# Patient Record
Sex: Male | Born: 1965 | Race: White | Hispanic: No | Marital: Married | Smoking: Never smoker
Health system: Southern US, Community
[De-identification: ages and names within clinical notes are randomized; demographics above are authoritative.]

## PROBLEM LIST (undated history)

## (undated) DIAGNOSIS — R7303 Prediabetes: Secondary | ICD-10-CM

## (undated) DIAGNOSIS — K219 Gastro-esophageal reflux disease without esophagitis: Secondary | ICD-10-CM

## (undated) DIAGNOSIS — G4730 Sleep apnea, unspecified: Secondary | ICD-10-CM

## (undated) DIAGNOSIS — M1990 Unspecified osteoarthritis, unspecified site: Secondary | ICD-10-CM

## (undated) DIAGNOSIS — I10 Essential (primary) hypertension: Secondary | ICD-10-CM

## (undated) DIAGNOSIS — F419 Anxiety disorder, unspecified: Secondary | ICD-10-CM

## (undated) HISTORY — PX: ESOPHAGOGASTRODUODENOSCOPY: SHX1529

## (undated) HISTORY — PX: ANAL FISSURECTOMY: SUR608

## (undated) HISTORY — PX: COLONOSCOPY: SHX174

---

## 2006-10-27 ENCOUNTER — Emergency Department: Payer: Self-pay

## 2007-10-10 ENCOUNTER — Other Ambulatory Visit: Payer: Self-pay

## 2007-10-10 ENCOUNTER — Emergency Department: Payer: Self-pay | Admitting: Emergency Medicine

## 2008-01-29 ENCOUNTER — Emergency Department: Payer: Self-pay | Admitting: Emergency Medicine

## 2008-02-10 ENCOUNTER — Emergency Department (HOSPITAL_COMMUNITY): Admission: EM | Admit: 2008-02-10 | Discharge: 2008-02-10 | Payer: Self-pay | Admitting: Emergency Medicine

## 2009-04-02 ENCOUNTER — Emergency Department: Payer: Self-pay | Admitting: Emergency Medicine

## 2009-05-14 ENCOUNTER — Ambulatory Visit: Payer: Self-pay | Admitting: Internal Medicine

## 2009-08-09 ENCOUNTER — Emergency Department: Payer: Self-pay

## 2011-04-21 LAB — URINALYSIS, ROUTINE W REFLEX MICROSCOPIC
Bilirubin Urine: NEGATIVE
Hgb urine dipstick: NEGATIVE
Specific Gravity, Urine: 1.025
Urobilinogen, UA: 1
pH: 7

## 2011-05-12 DIAGNOSIS — K210 Gastro-esophageal reflux disease with esophagitis: Secondary | ICD-10-CM

## 2011-05-12 DIAGNOSIS — F411 Generalized anxiety disorder: Secondary | ICD-10-CM | POA: Insufficient documentation

## 2011-05-12 DIAGNOSIS — I10 Essential (primary) hypertension: Secondary | ICD-10-CM | POA: Insufficient documentation

## 2011-07-26 ENCOUNTER — Emergency Department: Payer: Self-pay

## 2014-05-26 DIAGNOSIS — N529 Male erectile dysfunction, unspecified: Secondary | ICD-10-CM | POA: Insufficient documentation

## 2015-05-17 DIAGNOSIS — E785 Hyperlipidemia, unspecified: Secondary | ICD-10-CM | POA: Insufficient documentation

## 2016-01-03 DIAGNOSIS — G4733 Obstructive sleep apnea (adult) (pediatric): Secondary | ICD-10-CM | POA: Insufficient documentation

## 2017-10-16 ENCOUNTER — Encounter: Payer: Self-pay | Admitting: Emergency Medicine

## 2017-10-16 ENCOUNTER — Other Ambulatory Visit: Payer: Self-pay

## 2017-10-16 ENCOUNTER — Emergency Department: Payer: 59

## 2017-10-16 ENCOUNTER — Emergency Department
Admission: EM | Admit: 2017-10-16 | Discharge: 2017-10-16 | Disposition: A | Discharge status: Home / Self Care | Payer: 59 | Attending: Emergency Medicine | Admitting: Emergency Medicine

## 2017-10-16 DIAGNOSIS — F419 Anxiety disorder, unspecified: Secondary | ICD-10-CM | POA: Diagnosis not present

## 2017-10-16 DIAGNOSIS — I10 Essential (primary) hypertension: Secondary | ICD-10-CM | POA: Diagnosis not present

## 2017-10-16 HISTORY — DX: Gastro-esophageal reflux disease without esophagitis: K21.9

## 2017-10-16 HISTORY — DX: Essential (primary) hypertension: I10

## 2017-10-16 HISTORY — DX: Anxiety disorder, unspecified: F41.9

## 2017-10-16 LAB — CBC WITH DIFFERENTIAL/PLATELET
BASOS ABS: 0 10*3/uL (ref 0–0.1)
Basophils Relative: 1 %
EOS PCT: 2 %
Eosinophils Absolute: 0.1 10*3/uL (ref 0–0.7)
HEMATOCRIT: 50.9 % (ref 40.0–52.0)
Hemoglobin: 16.6 g/dL (ref 13.0–18.0)
LYMPHS ABS: 0.8 10*3/uL — AB (ref 1.0–3.6)
LYMPHS PCT: 16 %
MCH: 27.1 pg (ref 26.0–34.0)
MCHC: 32.6 g/dL (ref 32.0–36.0)
MCV: 83.1 fL (ref 80.0–100.0)
MONO ABS: 0.3 10*3/uL (ref 0.2–1.0)
Monocytes Relative: 7 %
NEUTROS ABS: 3.8 10*3/uL (ref 1.4–6.5)
Neutrophils Relative %: 74 %
PLATELETS: 224 10*3/uL (ref 150–440)
RBC: 6.13 MIL/uL — AB (ref 4.40–5.90)
RDW: 14.4 % (ref 11.5–14.5)
WBC: 5 10*3/uL (ref 3.8–10.6)

## 2017-10-16 LAB — BASIC METABOLIC PANEL
ANION GAP: 8 (ref 5–15)
BUN: 12 mg/dL (ref 6–20)
CO2: 27 mmol/L (ref 22–32)
Calcium: 9.4 mg/dL (ref 8.9–10.3)
Chloride: 104 mmol/L (ref 101–111)
Creatinine, Ser: 1.11 mg/dL (ref 0.61–1.24)
GFR calc Af Amer: >60 mL/min (ref >60)
GFR calc non Af Amer: >60 mL/min (ref >60)
GLUCOSE: 128 mg/dL — AB (ref 65–99)
POTASSIUM: 3.4 mmol/L — AB (ref 3.5–5.1)
Sodium: 139 mmol/L (ref 135–145)

## 2017-10-16 MED ORDER — DIAZEPAM 5 MG PO TABS
10.0000 mg | ORAL_TABLET | Freq: Once | ORAL | Status: AC
  Administered 2017-10-16: 10 mg via ORAL
  Filled 2017-10-16: qty 2

## 2017-10-16 MED ORDER — AMLODIPINE BESYLATE 5 MG PO TABS
10.0000 mg | ORAL_TABLET | Freq: Once | ORAL | Status: AC
  Administered 2017-10-16: 10 mg via ORAL
  Filled 2017-10-16: qty 2

## 2017-10-16 NOTE — ED Provider Notes (Signed)
[facility] Emergency Department Provider Note       Time seen: ----------------------------------------- 12:15 PM on 10/16/2017 -----------------------------------------   I have reviewed the triage vital signs and the nursing notes.  HISTORY   Chief Complaint Hypertension and Shortness of Breath    HPI Jeremy Gonzales is a 52 y.o. male with a history of anxiety, GERD and hypertension who presents to the ED for hypertension.  Patient went to the dentist today and had his blood pressure taken.  At the dentist office his blood pressure was noted to be 220s over 100.  He reports he has been off of his blood pressure medications for several months because he was feeling better.  On a good day his blood pressure he states is not very elevated.  He denies any recent illness, fevers, chills, chest pain, shortness of breath, vomiting or diarrhea but does have anxiety.  Past Medical History:  Diagnosis Date  . Anxiety   . GERD (gastroesophageal reflux disease)   . Hypertension     There are no active problems to display for this patient.   History reviewed. No pertinent surgical history.  Allergies Penicillins  Social History Social History   Tobacco Use  . Smoking status: Never Smoker  Substance Use Topics  . Alcohol use: Never    Frequency: Never  . Drug use: Never    Review of Systems Constitutional: Negative for fever. Cardiovascular: Negative for chest pain. Respiratory: Negative for shortness of breath. Gastrointestinal: Negative for abdominal pain, vomiting and diarrhea. Musculoskeletal: Negative for back pain. Skin: Negative for rash. Neurological: Negative for headaches, focal weakness or numbness. Psychiatric: Positive for anxiety  All systems negative/normal/unremarkable except as stated in the HPI  ____________________________________________   PHYSICAL EXAM:  VITAL SIGNS: ED Triage Vitals  Enc Vitals Group     BP 10/16/17  0924 (!) 180/104     Pulse Rate 10/16/17 0924 87     Resp 10/16/17 0924 18     Temp 10/16/17 0924 97.9 F (36.6 C)     Temp Source 10/16/17 0924 Oral     SpO2 10/16/17 0924 98 %     Weight 10/16/17 0925 250 lb (113.4 kg)     Height 10/16/17 0925 6\' 1"  (1.854 m)     Head Circumference --      Peak Flow --      Pain Score 10/16/17 0925 0     Pain Loc --      Pain Edu? --      Excl. in GC? --    Constitutional: Alert and oriented.  Anxious, no distress Eyes: Conjunctivae are normal. Normal extraocular movements. ENT   Head: Normocephalic and atraumatic.   Nose: No congestion/rhinnorhea.   Mouth/Throat: Mucous membranes are moist.   Neck: No stridor. Cardiovascular: Normal rate, regular rhythm. No murmurs, rubs, or gallops. Respiratory: Normal respiratory effort without tachypnea nor retractions. Breath sounds are clear and equal bilaterally. No wheezes/rales/rhonchi. Gastrointestinal: Soft and nontender. Normal bowel sounds Musculoskeletal: Nontender with normal range of motion in extremities. No lower extremity tenderness nor edema. Neurologic:  Normal speech and language. No gross focal neurologic deficits are appreciated.  Skin:  Skin is warm, dry and intact. No rash noted. Psychiatric: Mood and affect are normal. Speech and behavior are normal.  ____________________________________________  EKG: Interpreted by me.  Sinus rhythm rate 81 bpm, normal PR interval, normal QRS, normal QT.  ____________________________________________  ED COURSE:  As part of my medical decision making, I reviewed  the following data within the electronic MEDICAL RECORD NUMBER History obtained from family if available, nursing notes, old chart and ekg, as well as notes from prior ED visits. Patient presented for hypertension, we will assess with labs and imaging as indicated at this time.   Procedures ____________________________________________   LABS (pertinent  positives/negatives)  Labs Reviewed  CBC WITH DIFFERENTIAL/PLATELET - Abnormal; Notable for the following components:      Result Value   RBC 6.13 (*)    Lymphs Abs 0.8 (*)    All other components within normal limits  BASIC METABOLIC PANEL - Abnormal; Notable for the following components:   Potassium 3.4 (*)    Glucose, Bld 128 (*)    All other components within normal limits  ___________________________________________  DIFFERENTIAL DIAGNOSIS   Anxiety, hypertension, medication noncompliance, electrolyte abnormality  FINAL ASSESSMENT AND PLAN  Hypertension   Plan: The patient had presented for hypertension. Patient's labs are reassuring.  She was started back on his amlodipine and was given a dose of benzodiazepine here.  He is cleared for outpatient follow-up.   Ulice Dash, MD   Note: This note was generated in part or whole with voice recognition software. Voice recognition is usually quite accurate but there are transcription errors that can and very often do occur. I apologize for any typographical errors that were not detected and corrected.     Emily Filbert, MD 10/16/17 1247

## 2017-10-16 NOTE — ED Notes (Signed)
Pt presents today from dentist for HTN. Pt states at dentist office BP was 220/100's. Pt admits he is supose to be taking a HTN medication but stopped because he felt better. Pt is A/Ox4 and NAD. Awaiting EDP

## 2017-10-16 NOTE — ED Triage Notes (Signed)
Pt reports that he was at his dentist off today, they took his B/P and then he felt that his anxiety kicked in. He reports that now he feels dizzy and that he may pass out. Denies any chest pain states that he is SOB

## 2018-01-23 DIAGNOSIS — K56609 Unspecified intestinal obstruction, unspecified as to partial versus complete obstruction: Secondary | ICD-10-CM | POA: Insufficient documentation

## 2018-01-28 ENCOUNTER — Ambulatory Visit
Admission: RE | Admit: 2018-01-28 | Discharge: 2018-01-28 | Disposition: A | Discharge status: Home / Self Care | Payer: 59 | Source: Ambulatory Visit | Attending: Gastroenterology | Admitting: Gastroenterology

## 2018-01-28 ENCOUNTER — Other Ambulatory Visit: Payer: Self-pay | Admitting: Gastroenterology

## 2018-01-28 DIAGNOSIS — R110 Nausea: Secondary | ICD-10-CM | POA: Insufficient documentation

## 2018-02-21 ENCOUNTER — Ambulatory Visit: Payer: 59 | Admitting: Gastroenterology

## 2018-02-21 ENCOUNTER — Encounter: Payer: Self-pay | Admitting: Gastroenterology

## 2018-02-21 ENCOUNTER — Encounter: Payer: Self-pay | Admitting: *Deleted

## 2018-02-21 ENCOUNTER — Other Ambulatory Visit: Payer: Self-pay

## 2018-02-21 ENCOUNTER — Encounter

## 2018-02-21 VITALS — BP 177/97 | HR 85 | Ht 73.0 in | Wt 256.0 lb

## 2018-02-21 DIAGNOSIS — R140 Abdominal distension (gaseous): Secondary | ICD-10-CM

## 2018-02-21 DIAGNOSIS — R112 Nausea with vomiting, unspecified: Secondary | ICD-10-CM

## 2018-02-21 NOTE — Patient Instructions (Signed)
You are scheduled for a gastric emptying study at [facility] on Friday, August 16th @ 8:30am. Please arrive at the medical mall registration desk at 8:00am. You will need to stop all stomach medications (reflux, pain medications, nausea) 8 hours prior to scan. You cannot have anything to eat or drink after midnight.   If you need to reschedule this appointment for any reason, please contact central scheduling at [phone].

## 2018-02-21 NOTE — Progress Notes (Signed)
Gastroenterology Consultation  Referring Provider:     No ref. provider found Primary Care Physician:  Patient, No Pcp Per Primary Gastroenterologist:  Dr. Servando SnareWohl     Reason for Consultation:     Nausea        HPI:   Jeremy Gonzales is a 52 y.o. y/o male referred for consultation & management of Nausea by Dr. Patient, No Pcp Per.  This patient comes today after being [facility] 3 weeks ago for the same issues.  The patient was in the ER on July 2 With a finding on CT scan showing ileus versus partial small bowel obstruction.  The patient had started propranolol just prior to these symptoms starting.  There is report of bloating and burping.  The patient then was sent for a KUB by the nurse practitioner [facility] which showed a normal gas pattern.   The patient was recommended to double up on his PPI and to take Gas-X. The patient had a [facility] and [facility] with 2 polyps found on his screening colonoscopy that turned out to be hyperplastic and the biopsies done for his history of Barrett's esophagus did not show any Barrett's esophagus but did show signs of reflux. The patient's CT scan showing be dilated loops of small bowel [facility].   The patient reports that he feels very bloated in the morning.  He states that he drinks carbonated drinks in a large amount.  He also reports that after he eats in the morning he does not feel a bit hungry until well after lunch.  The patient thinks he has had slow motility in the past.  He was started on Reglan but denies ever having a gastric emptying study.  The patient had psychotic episodes in the Reglan and never took it again.  There is no report of any change in bowel habits black stools or bloody stools.  He also denies any vomiting above food or bile.  Past Medical History:  Diagnosis Date  . Anxiety   . GERD (gastroesophageal reflux disease)   . Hypertension     No past surgical history on file.  Prior  to Admission medications   Not on File    No family history on file.   Social History   Tobacco Use  . Smoking status: Never Smoker  Substance Use Topics  . Alcohol use: Never    Frequency: Never  . Drug use: Never    Allergies as of 02/21/2018 - never reviewed  Allergen Reaction Noted  . Metoclopramide  07/08/2017  . Promethazine  07/08/2017  . Penicillins  10/16/2017    Review of Systems:    All systems reviewed and negative except where noted in HPI.   Physical Exam:  There were no vitals taken for this visit. No LMP for male patient. General:   Alert,  Well-developed, well-nourished, pleasant and cooperative in NAD Head:  Normocephalic and atraumatic. Eyes:  Sclera clear, no icterus.   Conjunctiva pink. Ears:  Normal auditory acuity. Nose:  No deformity, discharge, or lesions. Mouth:  No deformity or lesions,oropharynx pink & moist. Neck:  Supple; no masses or thyromegaly. Lungs:  Respirations even and unlabored.  Clear throughout to auscultation.   No wheezes, crackles, or rhonchi. No acute distress. Heart:  Regular rate and rhythm; no murmurs, clicks, rubs, or gallops. Abdomen:  Normal bowel sounds.  No bruits.  Soft, non-tender and non-distended without masses, hepatosplenomegaly or  hernias noted.  No guarding or rebound tenderness.  Negative Carnett sign.   Rectal:  Deferred.  Msk:  Symmetrical without gross deformities.  Good, equal movement & strength bilaterally. Pulses:  Normal pulses noted. Extremities:  No clubbing or edema.  No cyanosis. Neurologic:  Alert and oriented x3;  grossly normal neurologically. Skin:  Intact without significant lesions or rashes.  No jaundice. Lymph Nodes:  No significant cervical adenopathy. Psych:  Alert and cooperative. Normal mood and affect.  Imaging Studies: Dg Abd Acute W/chest  Result Date: 01/28/2018 CLINICAL DATA:  One-week history of UPPER abdominal pain and nausea. Per consultation note dated 01/28/2018, the  patient had a recent abnormal CT demonstrating ileus versus small-bowel obstruction. EXAM: DG ABDOMEN ACUTE W/ 1V CHEST COMPARISON:  No prior abdominal imaging. Chest x-ray 10/16/2017. The CT mentioned in the consultation note earlier today is not in the [facility] system. FINDINGS: Bowel gas pattern unremarkable without evidence of obstruction or significant ileus. No evidence of free air or significant air-fluid levels on the erect image. Expected stool burden in the colon. Phleboliths low in the LEFT side of the pelvis. No visible opaque urinary tract calculi. Regional skeleton unremarkable. Cardiac silhouette normal in size, unchanged. Minimal atherosclerosis involving the aortic arch. Hilar and mediastinal contours otherwise unremarkable. Lungs clear. Bronchovascular markings normal. Pulmonary vascularity normal. No visible pleural effusions. No pneumothorax. IMPRESSION: 1. No acute abdominal abnormalities. 2.  No acute cardiopulmonary disease. Electronically Signed   By: Hulan Saas M.D.   On: 01/28/2018 16:01    Assessment and Plan:   Jeremy Gonzales is a 52 y.o. y/o male who had a history of ileus with resolution on a recent KUB.  The patient was seen by another gastroenterologist because he states he could not get to see me sooner.  The patient has been told that he does not need a repeat EGD since his last one did not show Barrett's and it is unlikely that peptic ulcer disease is causing his present symptoms.  The patient will be set up for a gastric emptying study.  If the study is positive the patient has been told that he will be required to eat multiple meals throughout the day and small amounts instead of 3 large meals.  If the gastric emptying study is normal the patient has been told to try and avoid carbonated drinks and see if any foods particularly his dairy products make his symptoms any worse and then see if eliminating them make him feel any better.  The patient has been explained  the plan and agrees with it.  Midge Minium, MD. Clementeen Graham    Note: This dictation was prepared with Dragon dictation along with smaller phrase technology. Any transcriptional errors that result from this process are unintentional.

## 2018-03-08 ENCOUNTER — Ambulatory Visit
Admission: RE | Admit: 2018-03-08 | Discharge: 2018-03-08 | Disposition: A | Discharge status: Home / Self Care | Payer: 59 | Source: Ambulatory Visit | Attending: Gastroenterology | Admitting: Gastroenterology

## 2018-03-08 DIAGNOSIS — R112 Nausea with vomiting, unspecified: Secondary | ICD-10-CM | POA: Diagnosis not present

## 2018-03-08 MED ORDER — TECHNETIUM TC 99M SULFUR COLLOID
2.0000 | Freq: Once | INTRAVENOUS | Status: AC | PRN
  Administered 2018-03-08: 2.7 via ORAL

## 2018-03-14 ENCOUNTER — Telehealth: Payer: Self-pay

## 2018-03-14 NOTE — Telephone Encounter (Signed)
Pt notified of gastric emptying study. He stated he is still having a lot of bloating. Please advise.

## 2018-03-18 NOTE — Telephone Encounter (Signed)
Set the patient up for a small bowel follow-through to see if there is any dilation of the small intestines to explain his bloating.

## 2018-03-19 NOTE — Telephone Encounter (Signed)
LVM for pt to return my call.

## 2018-04-05 NOTE — Telephone Encounter (Signed)
LVM for pt to return my call.

## 2018-10-20 IMAGING — CR DG ABDOMEN ACUTE W/ 1V CHEST
4 series · 4 of 4 positions shown · non-contrast
Comparison: No prior abdominal imaging.

CLINICAL DATA: One-week history of UPPER abdominal pain and nausea.
Per consultation note dated 01/28/2018, the patient had a recent
abnormal CT demonstrating ileus versus small-bowel obstruction.

EXAM:
DG ABDOMEN ACUTE W/ 1V CHEST

[chest pa]
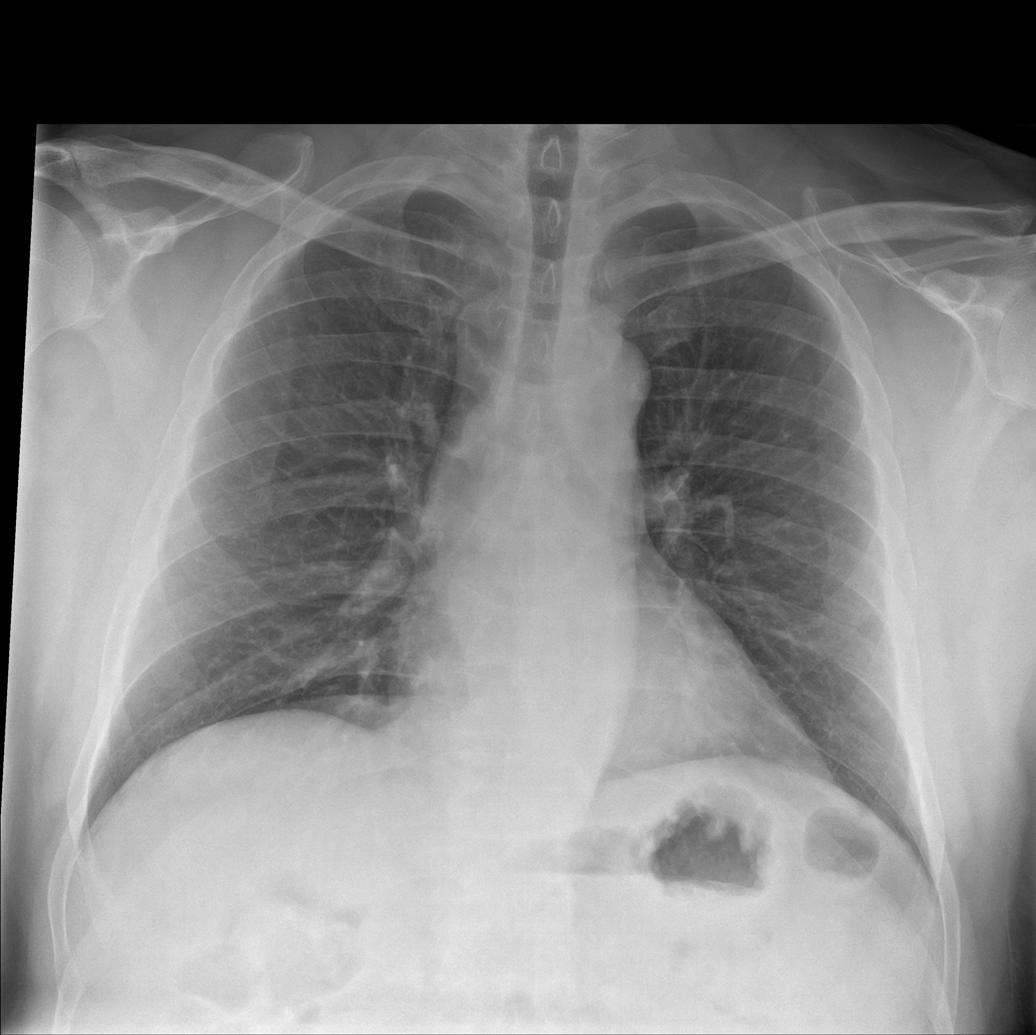

[abdomen erect]
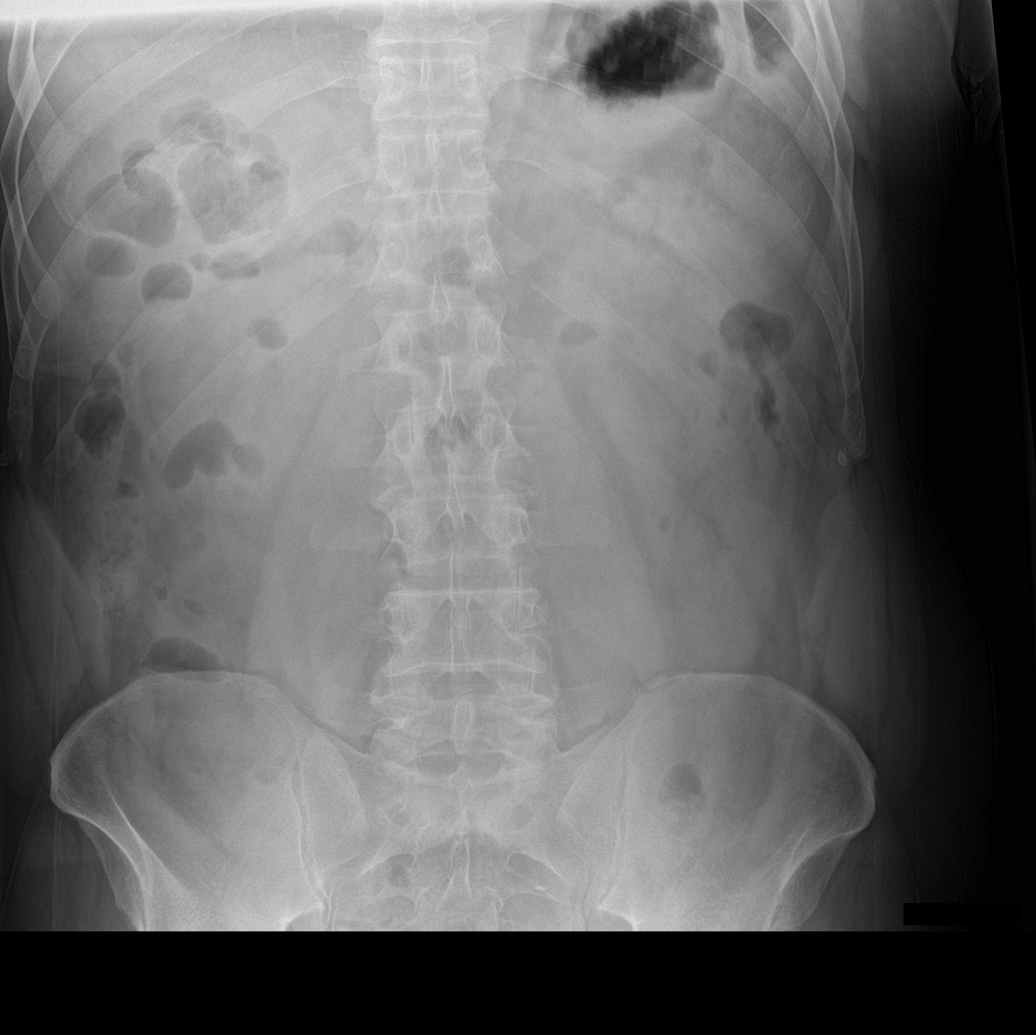

[abdomen supine (1 of 2)]
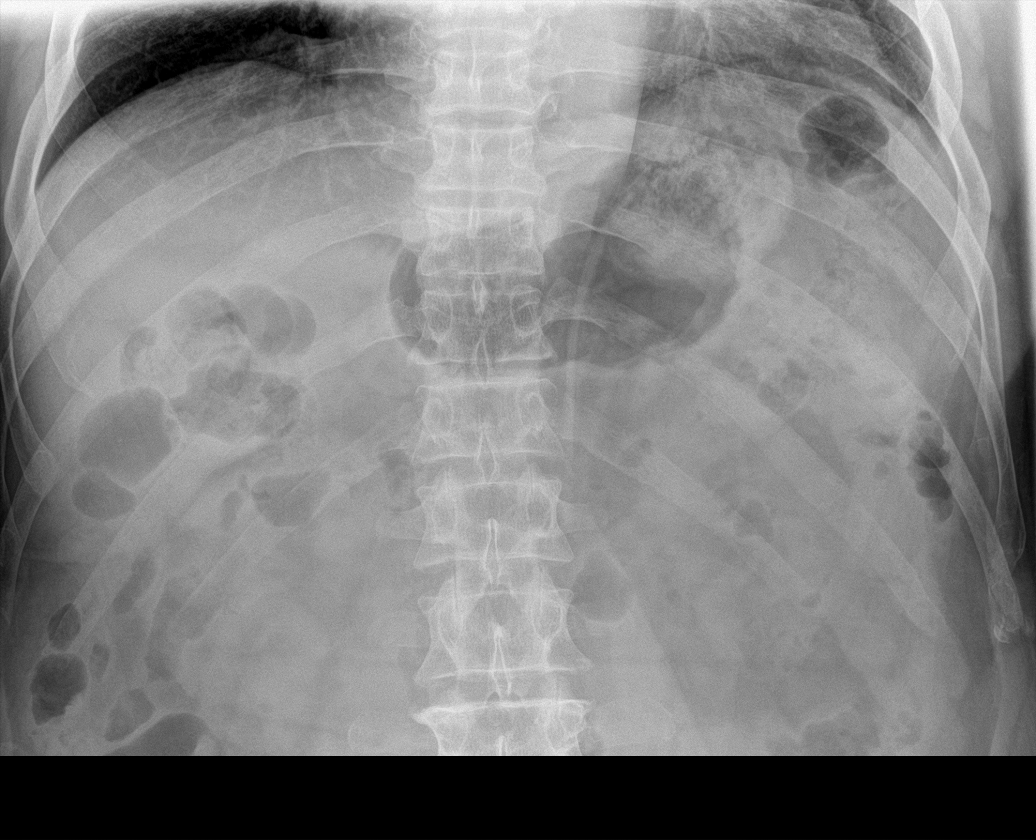

[abdomen supine (2 of 2)]
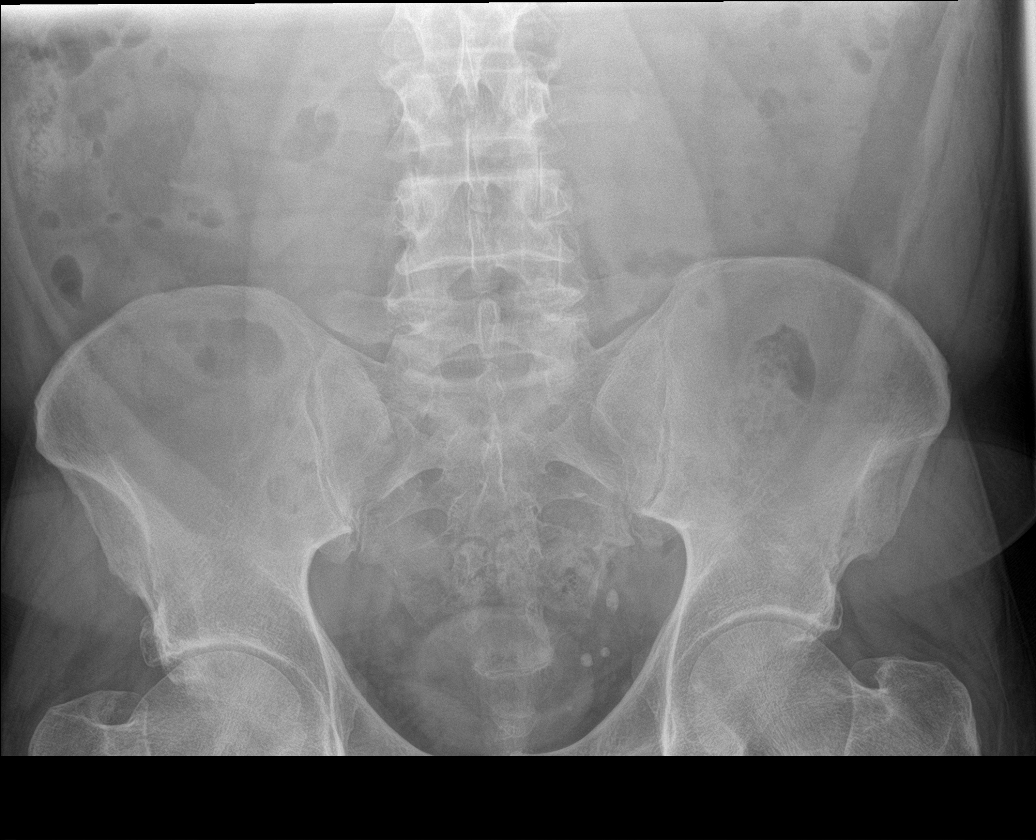

[4 of 4 positions shown; findings below may reference images not displayed]

Chest x-ray 10/16/2017. The
CT mentioned in the consultation note earlier today is not in the
[REDACTED].
FINDINGS: Bowel gas pattern unremarkable without evidence of obstruction or
significant ileus. No evidence of free air or significant air-fluid
levels on the erect image. Expected stool burden in the colon.
Phleboliths low in the LEFT side of the pelvis. No visible opaque
urinary tract calculi. Regional skeleton unremarkable.

Cardiac silhouette normal in size, unchanged.. Minimal
atherosclerosis involving the aortic arch. Hilar and mediastinal
contours otherwise unremarkable. Lungs clear. Bronchovascular
markings normal. Pulmonary vascularity normal. No visible pleural
effusions. No pneumothorax.
IMPRESSION: 1. No acute abdominal abnormalities.
2.  No acute cardiopulmonary disease.

## 2019-01-08 ENCOUNTER — Telehealth: Payer: Self-pay | Admitting: Gastroenterology

## 2019-01-08 NOTE — Telephone Encounter (Signed)
Pt left vm he went to Er yesterday or day before yesterday  For being bloated and constipated they gave him Miralax but he states it has not helped pt wanted apt for Dr. Allen Norris informed pt  I have 01/23/19 available pt wanted sooner apt please advise

## 2019-01-08 NOTE — Telephone Encounter (Signed)
Please contact pt and inform him Dr. Allen Norris is on hospital call all next week and not available for clinic. If July 2nd is the next available, then unfortunately that's all we can offer at this time unless we can any cancellations earlier in the week.

## 2019-06-23 ENCOUNTER — Ambulatory Visit: Payer: 59 | Admitting: Gastroenterology

## 2019-06-23 ENCOUNTER — Encounter: Payer: Self-pay | Admitting: *Deleted

## 2019-06-23 DIAGNOSIS — R140 Abdominal distension (gaseous): Secondary | ICD-10-CM

## 2021-03-24 NOTE — H&P (Signed)
NAME: Jeremy Gonzales, [place]GER[place]OD MEDICAL RECORD NO: [id number] ACCOUNT NO: [id number] DATE OF BIRTH: 05-26-1966 PHYSICIAN: Suszanne Conners. Evelene Croon, MD  History and Physical   DATE OF ADMISSION: 03/31/2021  Same day surgery 03/31/2021.  CHIEF COMPLAINT:  Difficulty voiding and pelvic pain.  HISTORY OF PRESENT ILLNESS:  The patient is a 55 year old white male with a long history of BPH and lower urinary tract symptoms.  He tried tamsulosin for 3 months, but had headaches and could not tolerate this.  He continues to have significant voiding  symptoms with an IPSS score of 28 and a quality of life score of 4.  He is now on alfuzosin and tadalafil and has had minimal improvement of his voiding symptoms.  Uroflow study on 08/25 indicated a maximum flow rate of 12 mL per second with an average  flow rate of 6.3 mL per second and a postvoid residual of 9.9 mL based upon a voided volume of 215 mL. Prostate ultrasound on 08/08 indicated a 29.6 mL prostate without hypoechoic lesions.  He comes in now for cystoscopy.  PAST MEDICAL HISTORY:  ALLERGIES:  THE PATIENT IS ALLERGIC TO PENICILLIN.  CURRENT MEDICATIONS:  Aciphex, amlodipine, Ativan, tadalafil, and alfuzosin.  PAST SURGICAL HISTORY:  Anal anal fissure repair in 1990.  PAST AND CURRENT MEDICAL CONDITIONS: 1.  Hypertension. 2.  GERD. 3.  Anxiety. 4.  Borderline diabetes.  REVIEW OF SYSTEMS:  The patient denies chest pain, shortness of breath, heart disease, or stroke.  SOCIAL HISTORY:  The patient denies tobacco or alcohol use.  FAMILY HISTORY:  Father died at age 69 with pancreatic cancer, but also had diabetes and hypertension.  Mother died at age 19 of a stroke and had diabetes.  There is no family history of urologic disease or urological malignancy.  PHYSICAL EXAMINATION: VITAL SIGNS:  Height 6 feet 1 inch, weight 256 pounds, BMI 34. GENERAL:  Obese white male in no acute distress. HEENT:  Sclerae are clear.  Pupils are equally round, reactive  to light and accommodation.  Extraocular movements are intact. NECK:  No palpable masses or tenderness. LYMPHATIC:  No palpable cervical or inguinal adenopathy. PULMONARY:  Lungs clear to auscultation. CARDIOVASCULAR:  Regular rhythm and rate without audible murmurs. ABDOMEN:  Soft, nontender.  No CVA tenderness. GENITOURINARY:  Circumcised.  Testes smooth, nontender, approximately 20 mL in size each. RECTAL:  40 gram, smooth, nontender prostate. NEUROVASCULAR:  Alert and oriented x3.  IMPRESSION: 1.  Benign prostatic hypertrophy with lower urinary tract symptoms. 2.  Erectile dysfunction. 3.  Pelvic and back pain.  PLAN:  Cystoscopy.   ROH D: 03/23/2021 3:19:28 pm T: 03/23/2021 5:10:00 pm  JOB: [id number]/ [id number]

## 2021-03-25 ENCOUNTER — Other Ambulatory Visit: Payer: Self-pay

## 2021-03-25 ENCOUNTER — Encounter
Admission: RE | Admit: 2021-03-25 | Discharge: 2021-03-25 | Disposition: A | Discharge status: Home / Self Care | Payer: 59 | Source: Ambulatory Visit | Attending: Urology | Admitting: Urology

## 2021-03-25 HISTORY — DX: Sleep apnea, unspecified: G47.30

## 2021-03-25 NOTE — Patient Instructions (Addendum)
Your procedure is scheduled on:03-31-21 Thursday Report to the Registration Desk on the 1st floor of the Medical Mall.Then proceed to the 2nd floor Surgery Desk in the Medical Mall To find out your arrival time, please call [phone] between 1PM - 3PM on:03-30-21 Wednesday  REMEMBER: Instructions that are not followed completely may result in serious medical risk, up to and including death; or upon the discretion of your surgeon and anesthesiologist your surgery may need to be rescheduled.  Do not eat food after midnight the night before surgery.  No gum chewing, lozengers or hard candies.  You may however, drink CLEAR liquids up to 2 hours before you are scheduled to arrive for your surgery. Do not drink anything within 2 hours of your scheduled arrival time.  Clear liquids include: - water  - apple juice without pulp - gatorade (not RED, PURPLE, OR BLUE) - black coffee or tea (Do NOT add milk or creamers to the coffee or tea) Do NOT drink anything that is not on this list.  TAKE THESE MEDICATIONS THE MORNING OF SURGERY WITH A SIP OF WATER: -Aciphex (Rabeprazole)-take one the night before and one on the morning of surgery - helps to prevent nausea after surgery. -You may take Ativan (Lorazepam) if needed for anxiety the day of surgery   One week prior to surgery: Stop Anti-inflammatories (NSAIDS) such as Advil, Aleve, Ibuprofen, Motrin, Naproxen, Naprosyn and Aspirin based products such as Excedrin, Goodys Powder, BC Powder.You may however, continue to take Tylenol if needed for pain up until the day of surgery.  Stop ANY OVER THE COUNTER supplements/vitamins NOW (03-25-21) until after surgery.  No Alcohol for 24 hours before or after surgery.  No Smoking including e-cigarettes for 24 hours prior to surgery.  No chewable tobacco products for at least 6 hours prior to surgery.  No nicotine patches on the day of surgery.  Do not use any "recreational" drugs for at least a week prior  to your surgery.  Please be advised that the combination of cocaine and anesthesia may have negative outcomes, up to and including death. If you test positive for cocaine, your surgery will be cancelled.  On the morning of surgery brush your teeth with toothpaste and water, you may rinse your mouth with mouthwash if you wish. Do not swallow any toothpaste or mouthwash.  Do not wear jewelry, make-up, hairpins, clips or nail polish.  Do not wear lotions, powders, or perfumes.   Do not shave body from the neck down 48 hours prior to surgery just in case you cut yourself which could leave a site for infection.  Also, freshly shaved skin may become irritated if using the CHG soap.  Contact lenses, hearing aids and dentures may not be worn into surgery.  Do not bring valuables to the hospital. [facility] is not responsible for any missing/lost belongings or valuables.   Bring your C-PAP to the hospital with you  Notify your doctor if there is any change in your medical condition (cold, fever, infection).  Wear comfortable clothing (specific to your surgery type) to the hospital.  After surgery, you can help prevent lung complications by doing breathing exercises.  Take deep breaths and cough every 1-2 hours. Your doctor may order a device called an Incentive Spirometer to help you take deep breaths. When coughing or sneezing, hold a pillow firmly against your incision with both hands. This is called "splinting." Doing this helps protect your incision. It also decreases belly discomfort.  If  you are being admitted to the hospital overnight, leave your suitcase in the car. After surgery it may be brought to your room.  If you are being discharged the day of surgery, you will not be allowed to drive home. You will need a responsible adult (18 years or older) to drive you home and stay with you that night.   If you are taking public transportation, you will need to have a responsible adult  (18 years or older) with you. Please confirm with your physician that it is acceptable to use public transportation.   Please call the Pre-admissions Testing Dept. at [phone] if you have any questions about these instructions.  Surgery Visitation Policy:  Patients undergoing a surgery or procedure may have one family member or support person with them as long as that person is not COVID-19 positive or experiencing its symptoms.  That person may remain in the waiting area during the procedure.  Inpatient Visitation:    Visiting hours are 7 a.m. to 8 p.m. Inpatients will be allowed two visitors daily. The visitors may change each day during the patient's stay. No visitors under the age of 62. Any visitor under the age of 70 must be accompanied by an adult. The visitor must pass COVID-19 screenings, use hand sanitizer when entering and exiting the patient's room and wear a mask at all times, including in the patient's room. Patients must also wear a mask when staff or their visitor are in the room. Masking is required regardless of vaccination status.

## 2021-03-31 ENCOUNTER — Ambulatory Visit: Payer: BC Managed Care – PPO | Admitting: Certified Registered"

## 2021-03-31 ENCOUNTER — Encounter
Admission: RE | Disposition: A | Discharge status: Home / Self Care | Payer: Self-pay | Source: Home / Self Care | Attending: Urology

## 2021-03-31 ENCOUNTER — Encounter: Payer: Self-pay | Admitting: Urology

## 2021-03-31 ENCOUNTER — Ambulatory Visit
Admission: RE | Admit: 2021-03-31 | Discharge: 2021-03-31 | Disposition: A | Discharge status: Home / Self Care | Payer: BC Managed Care – PPO | Attending: Urology | Admitting: Urology

## 2021-03-31 DIAGNOSIS — Z79899 Other long term (current) drug therapy: Secondary | ICD-10-CM | POA: Insufficient documentation

## 2021-03-31 DIAGNOSIS — Z6834 Body mass index (BMI) 34.0-34.9, adult: Secondary | ICD-10-CM | POA: Diagnosis not present

## 2021-03-31 DIAGNOSIS — Z823 Family history of stroke: Secondary | ICD-10-CM | POA: Diagnosis not present

## 2021-03-31 DIAGNOSIS — R102 Pelvic and perineal pain: Secondary | ICD-10-CM | POA: Diagnosis not present

## 2021-03-31 DIAGNOSIS — R350 Frequency of micturition: Secondary | ICD-10-CM | POA: Insufficient documentation

## 2021-03-31 DIAGNOSIS — Z833 Family history of diabetes mellitus: Secondary | ICD-10-CM | POA: Diagnosis not present

## 2021-03-31 DIAGNOSIS — K219 Gastro-esophageal reflux disease without esophagitis: Secondary | ICD-10-CM | POA: Insufficient documentation

## 2021-03-31 DIAGNOSIS — E669 Obesity, unspecified: Secondary | ICD-10-CM | POA: Insufficient documentation

## 2021-03-31 DIAGNOSIS — Z880 Allergy status to penicillin: Secondary | ICD-10-CM | POA: Diagnosis not present

## 2021-03-31 DIAGNOSIS — Z8042 Family history of malignant neoplasm of prostate: Secondary | ICD-10-CM | POA: Diagnosis not present

## 2021-03-31 DIAGNOSIS — I10 Essential (primary) hypertension: Secondary | ICD-10-CM | POA: Insufficient documentation

## 2021-03-31 DIAGNOSIS — N401 Enlarged prostate with lower urinary tract symptoms: Secondary | ICD-10-CM | POA: Diagnosis not present

## 2021-03-31 HISTORY — PX: CYSTOSCOPY: SHX5120

## 2021-03-31 SURGERY — CYSTOSCOPY
Anesthesia: General

## 2021-03-31 MED ORDER — FENTANYL CITRATE (PF) 100 MCG/2ML IJ SOLN: 25.0000 ug | INTRAMUSCULAR | Status: DC | PRN

## 2021-03-31 MED ORDER — DEXMEDETOMIDINE (PRECEDEX) IN NS 20 MCG/5ML (4 MCG/ML) IV SYRINGE
PREFILLED_SYRINGE | INTRAVENOUS | Status: DC | PRN
  Administered 2021-03-31: 8 ug via INTRAVENOUS
  Administered 2021-03-31: 12 ug via INTRAVENOUS

## 2021-03-31 MED ORDER — LIDOCAINE HCL URETHRAL/MUCOSAL 2 % EX GEL
CUTANEOUS | Status: DC | PRN
  Administered 2021-03-31: 1 via URETHRAL

## 2021-03-31 MED ORDER — LIDOCAINE HCL (CARDIAC) PF 100 MG/5ML IV SOSY
PREFILLED_SYRINGE | INTRAVENOUS | Status: DC | PRN
  Administered 2021-03-31: 100 mg via INTRAVENOUS

## 2021-03-31 MED ORDER — LACTATED RINGERS IV SOLN: INTRAVENOUS | Status: DC

## 2021-03-31 MED ORDER — ONDANSETRON HCL 4 MG/2ML IJ SOLN: 4.0000 mg | Freq: Once | INTRAMUSCULAR | Status: DC | PRN

## 2021-03-31 MED ORDER — GLYCOPYRROLATE 0.2 MG/ML IJ SOLN
INTRAMUSCULAR | Status: DC | PRN
  Administered 2021-03-31: .2 mg via INTRAVENOUS

## 2021-03-31 MED ORDER — CHLORHEXIDINE GLUCONATE 0.12 % MT SOLN: 15.0000 mL | Freq: Once | OROMUCOSAL | Status: AC

## 2021-03-31 MED ORDER — PROPOFOL 10 MG/ML IV BOLUS
INTRAVENOUS | Status: AC
  Filled 2021-03-31: qty 40

## 2021-03-31 MED ORDER — PHENYLEPHRINE HCL (PRESSORS) 10 MG/ML IV SOLN
INTRAVENOUS | Status: DC | PRN
  Administered 2021-03-31: 100 ug via INTRAVENOUS

## 2021-03-31 MED ORDER — DEXAMETHASONE SODIUM PHOSPHATE 10 MG/ML IJ SOLN
INTRAMUSCULAR | Status: DC | PRN
  Administered 2021-03-31: 8 mg via INTRAVENOUS

## 2021-03-31 MED ORDER — CHLORHEXIDINE GLUCONATE 0.12 % MT SOLN
OROMUCOSAL | Status: AC
  Administered 2021-03-31: 15 mL via OROMUCOSAL
  Filled 2021-03-31: qty 15

## 2021-03-31 MED ORDER — URIBEL 118 MG PO CAPS: 1.0000 | ORAL_CAPSULE | Freq: Four times a day (QID) | ORAL | 3 refills | Status: DC | PRN

## 2021-03-31 MED ORDER — ONDANSETRON HCL 4 MG/2ML IJ SOLN
INTRAMUSCULAR | Status: DC | PRN
  Administered 2021-03-31: 4 mg via INTRAVENOUS

## 2021-03-31 MED ORDER — LEVOFLOXACIN IN D5W 500 MG/100ML IV SOLN
500.0000 mg | Freq: Once | INTRAVENOUS | Status: AC
  Administered 2021-03-31: 500 mg via INTRAVENOUS

## 2021-03-31 MED ORDER — LEVOFLOXACIN 500 MG PO TABS: 500.0000 mg | ORAL_TABLET | Freq: Every day | ORAL | 0 refills | Status: DC

## 2021-03-31 MED ORDER — LIDOCAINE HCL URETHRAL/MUCOSAL 2 % EX GEL
CUTANEOUS | Status: AC
  Filled 2021-03-31: qty 10

## 2021-03-31 MED ORDER — MIDAZOLAM HCL 2 MG/2ML IJ SOLN
INTRAMUSCULAR | Status: DC | PRN
  Administered 2021-03-31: 2 mg via INTRAVENOUS

## 2021-03-31 MED ORDER — PHENYLEPHRINE HCL (PRESSORS) 10 MG/ML IV SOLN
INTRAVENOUS | Status: AC
  Filled 2021-03-31: qty 1

## 2021-03-31 MED ORDER — FENTANYL CITRATE (PF) 100 MCG/2ML IJ SOLN
INTRAMUSCULAR | Status: AC
  Filled 2021-03-31: qty 2

## 2021-03-31 MED ORDER — MIDAZOLAM HCL 2 MG/2ML IJ SOLN
INTRAMUSCULAR | Status: AC
  Filled 2021-03-31: qty 2

## 2021-03-31 MED ORDER — LEVOFLOXACIN IN D5W 500 MG/100ML IV SOLN
INTRAVENOUS | Status: AC
  Filled 2021-03-31: qty 100

## 2021-03-31 MED ORDER — PROPOFOL 10 MG/ML IV BOLUS
INTRAVENOUS | Status: DC | PRN
  Administered 2021-03-31: 200 mg via INTRAVENOUS

## 2021-03-31 MED ORDER — FENTANYL CITRATE (PF) 100 MCG/2ML IJ SOLN
INTRAMUSCULAR | Status: DC | PRN
  Administered 2021-03-31: 100 ug via INTRAVENOUS

## 2021-03-31 MED ORDER — ORAL CARE MOUTH RINSE: 15.0000 mL | Freq: Once | OROMUCOSAL | Status: AC

## 2021-03-31 SURGICAL SUPPLY — 19 items
BAG DRAIN CYSTO-URO LG1000N (MISCELLANEOUS) ×2 IMPLANT
DRAPE UTILITY 15X26 TOWEL STRL (DRAPES) ×1 IMPLANT
ELECT REM PT RETURN 9FT ADLT (ELECTROSURGICAL) ×2
ELECTRODE REM PT RTRN 9FT ADLT (ELECTROSURGICAL) ×1 IMPLANT
GAUZE 4X4 16PLY ~~LOC~~+RFID DBL (SPONGE) ×3 IMPLANT
GLOVE SURG ENC MOIS LTX SZ7 (GLOVE) ×2 IMPLANT
GLOVE SURG ENC MOIS LTX SZ7.5 (GLOVE) ×2 IMPLANT
GOWN STRL REUS W/ TWL LRG LVL3 (GOWN DISPOSABLE) ×1 IMPLANT
GOWN STRL REUS W/ TWL XL LVL3 (GOWN DISPOSABLE) ×1 IMPLANT
GOWN STRL REUS W/TWL LRG LVL3 (GOWN DISPOSABLE) ×2
GOWN STRL REUS W/TWL XL LVL3 (GOWN DISPOSABLE) ×2
KIT TURNOVER CYSTO (KITS) ×2 IMPLANT
MANIFOLD NEPTUNE II (INSTRUMENTS) ×2 IMPLANT
PACK CYSTO AR (MISCELLANEOUS) ×2 IMPLANT
SET CYSTO W/LG BORE CLAMP LF (SET/KITS/TRAYS/PACK) ×2 IMPLANT
SURGILUBE 2OZ TUBE FLIPTOP (MISCELLANEOUS) ×2 IMPLANT
WATER STERILE IRR 1000ML POUR (IV SOLUTION) ×1 IMPLANT
WATER STERILE IRR 3000ML UROMA (IV SOLUTION) ×2 IMPLANT
WATER STERILE IRR 500ML POUR (IV SOLUTION) ×3 IMPLANT

## 2021-03-31 NOTE — Transfer of Care (Signed)
Immediate Anesthesia Transfer of Care Note  Patient: Jeremy Gonzales  Procedure(s) Performed: CYSTOSCOPY  Patient Location: PACU  Anesthesia Type:General  Level of Consciousness: drowsy  Airway & Oxygen Therapy: Patient Spontanous Breathing and Patient connected to face mask oxygen  Post-op Assessment: Report given to RN  Post vital signs: stable  Last Vitals:  Vitals Value Taken Time  BP 112/72 03/31/21 0800  Temp    Pulse 72 03/31/21 0800  Resp 10 03/31/21 0800  SpO2 97 % 03/31/21 0800  Vitals shown include unvalidated device data.  Last Pain:  Vitals:   03/31/21 0617  PainSc: 0-No pain         Complications: No notable events documented.

## 2021-03-31 NOTE — Discharge Instructions (Addendum)
Cystoscopy Cystoscopy is a procedure that is used to help diagnose and sometimes treat conditions that affect the lower urinary tract. The lower urinary tract includes the bladder and the urethra. The urethra is the tube that drains urine from the bladder. Cystoscopy is done using a thin, tube-shaped instrument with a light and camera at the end (cystoscope). The cystoscope may be hard or flexible, depending on the goal of the procedure. The cystoscope is inserted through the urethra, into the bladder. Cystoscopy may be recommended if you have: Urinary tract infections that keep coming back. Blood in the urine (hematuria). An inability to control when you urinate (urinary incontinence) or an overactive bladder. Unusual cells found in a urine sample. A blockage in the urethra, such as a urinary stone. Painful urination. An abnormality in the bladder found during an intravenous pyelogram (IVP) or CT scan. Cystoscopy may also be done to remove a sample of tissue to be examined under a microscope (biopsy). Tell a health care provider about: Any allergies you have. All medicines you are taking, including vitamins, herbs, eye drops, creams, and over-the-counter medicines. Any problems you or family members have had with anesthetic medicines. Any blood disorders you have. Any surgeries you have had. Any medical conditions you have. Whether you are pregnant or may be pregnant. What are the risks? Generally, this is a safe procedure. However, problems may occur, including: Infection. Bleeding. Allergic reactions to medicines. Damage to other structures or organs. What happens before the procedure? Medicines Ask your health care provider about: Changing or stopping your regular medicines. This is especially important if you are taking diabetes medicines or blood thinners. Taking medicines such as aspirin and ibuprofen. These medicines can thin your blood. Do not take these medicines unless your  health care provider tells you to take them. Taking over-the-counter medicines, vitamins, herbs, and supplements. Tests You may have an exam or testing, such as: X-rays of the bladder, urethra, or kidneys. CT scan of the abdomen or pelvis. Urine tests to check for signs of infection. General instructions Follow instructions from your health care provider about eating or drinking restrictions. Ask your health care provider what steps will be taken to help prevent infection. These steps may include: Washing skin with a germ-killing soap. Taking antibiotic medicine. Plan to have a responsible adult take you home from the hospital or clinic. What happens during the procedure? You will be given one or more of the following: A medicine to help you relax (sedative). A medicine to numb the area (local anesthetic). The area around the opening of your urethra will be cleaned. The cystoscope will be passed through your urethra into your bladder. Germ-free (sterile) fluid will flow through the cystoscope to fill your bladder. The fluid will stretch your bladder so that your health care provider can clearly examine your bladder walls. Your doctor will look at the urethra and bladder. Your doctor may take a biopsy or remove stones. The cystoscope will be removed, and your bladder will be emptied. The procedure may vary among health care providers and hospitals. What can I expect after the procedure? After the procedure, it is common to have: Some soreness or pain in your abdomen and urethra. Urinary symptoms. These include: Mild pain or burning when you urinate. Pain should stop within a few minutes after you urinate. This may last for up to 1 week. A small amount of blood in your urine for several days. Feeling like you need to urinate but producing only a  small amount of urine. Follow these instructions at home: Medicines Take over-the-counter and prescription medicines only as told by your  health care provider. If you were prescribed an antibiotic medicine, take it as told by your health care provider. Do not stop taking the antibiotic even if you start to feel better. General instructions Return to your normal activities as told by your health care provider. Ask your health care provider what activities are safe for you. If you were given a sedative during the procedure, it can affect you for several hours. Do not drive or operate machinery until your health care provider says that it is safe. Watch for any blood in your urine. If the amount of blood in your urine increases, call your health care provider. Follow instructions from your health care provider about eating or drinking restrictions. If a tissue sample was removed for testing (biopsy) during your procedure, it is up to you to get your test results. Ask your health care provider, or the department that is doing the test, when your results will be ready. Drink enough fluid to keep your urine pale yellow. Keep all follow-up visits. This is important. Contact a health care provider if: You have pain that gets worse or does not get better with medicine, especially pain when you urinate. You have trouble urinating. You have more blood in your urine. Get help right away if: You have blood clots in your urine. You have abdominal pain. You have a fever or chills. You are unable to urinate. Summary Cystoscopy is a procedure that is used to help diagnose and sometimes treat conditions that affect the lower urinary tract. Cystoscopy is done using a thin, tube-shaped instrument with a light and camera at the end. After the procedure, it is common to have some soreness or pain in your abdomen and urethra. Watch for any blood in your urine. If the amount of blood in your urine increases, call your health care provider. If you were prescribed an antibiotic medicine, take it as told by your health care provider. Do not stop taking  the antibiotic even if you start to feel better. This information is not intended to replace advice given to you by your health care provider. Make sure you discuss any questions you have with your health care provider. Document Revised: 02/20/2020 Document Reviewed: 02/20/2020 Elsevier Patient Education  2022 [employer]    AMBULATORY SURGERY  DISCHARGE INSTRUCTIONS   The drugs that you were given will stay in your system until tomorrow so for the next 24 hours you should not:  Drive an automobile Make any legal decisions Drink any alcoholic beverage   You may resume regular meals tomorrow.  Today it is better to start with liquids and gradually work up to solid foods.  You may eat anything you prefer, but it is better to start with liquids, then soup and crackers, and gradually work up to solid foods.   Please notify your doctor immediately if you have any unusual bleeding, trouble breathing, redness and pain at the surgery site, drainage, fever, or pain not relieved by medication.     Additional Instructions:

## 2021-03-31 NOTE — Anesthesia Procedure Notes (Signed)
Procedure Name: LMA Insertion Date/Time: 03/31/2021 7:34 AM Performed by: Jaye Beagle, CRNA Pre-anesthesia Checklist: Patient identified, Emergency Drugs available, Suction available and Patient being monitored Patient Re-evaluated:Patient Re-evaluated prior to induction Oxygen Delivery Method: Circle system utilized Preoxygenation: Pre-oxygenation with 100% oxygen Induction Type: IV induction Ventilation: Mask ventilation without difficulty LMA: LMA inserted LMA Size: 5.0 Tube secured with: Tape Dental Injury: Teeth and Oropharynx as per pre-operative assessment

## 2021-03-31 NOTE — Op Note (Signed)
Cystoscopy Procedure Note  Indications: BPH with lower urinary tract symptoms (N40.1)  Pre-operative Diagnosis: Urinary frequency and BPH  Post-operative Diagnosis: Same  Surgeon: Orson Ape MD  Assistants: None  Anesthesia: General and intraurethral lidocaine gel  ASA Class: 1  Procedure: Cystoscopy  (CPT 52000)  Procedure Details  The risks, benefits, complications, treatment options, and expected outcomes were discussed with the patient. The patient concurred with the proposed plan, giving informed consent.  Cystoscopy was performed today under local anesthesia, using sterile technique. The patient was placed in the lithotomy position, prepped with Betadine, and draped in the usual sterile fashion. A 21 French sheath rigid cystoscope was used to inspect both the urethra and bladder using the 30 and 70 degree lenses.  The bladder was then drained and the cystoscope was removed.  10 cc of viscous Xylocaine was instilled within the urethra and bladder.  Findings: Anterior urethra: normal without strictures and without scarring. Prostatic urethra: 3+ large bilobar prostatic hyperplasia, with a 3 cm prostatic urethral length Bladder: 1+ trabeculation, without lesions. Ureteral orifices were seen bilaterally. Ureteral orifices were in the normal location and bilateral ureteral orifices were effluxing clear urine.         Specimens: None               Complications:  None; patient tolerated the procedure well.         Disposition: To home after 30 minute observation.         Condition: stable  Attending Attestation: I was present and scrubbed for the entire procedure.

## 2021-03-31 NOTE — Anesthesia Preprocedure Evaluation (Signed)
Anesthesia Evaluation  Patient identified by MRN, date of birth, ID band Patient awake    Reviewed: Allergy & Precautions, H&P , NPO status , Patient's Chart, lab work & pertinent test results, reviewed documented beta blocker date and time   History of Anesthesia Complications Negative for: history of anesthetic complications  Airway Mallampati: II  TM Distance: >3 FB Neck ROM: full    Dental  (+) Dental Advidsory Given, Caps, Teeth Intact   Pulmonary neg shortness of breath, sleep apnea and Continuous Positive Airway Pressure Ventilation , neg COPD, neg recent URI,    Pulmonary exam normal breath sounds clear to auscultation       Cardiovascular Exercise Tolerance: Good hypertension, (-) angina(-) Past MI and (-) Cardiac Stents Normal cardiovascular exam(-) dysrhythmias (-) Valvular Problems/Murmurs Rhythm:regular Rate:Normal     Neuro/Psych PSYCHIATRIC DISORDERS Anxiety negative neurological ROS     GI/Hepatic Neg liver ROS, GERD  ,  Endo/Other  diabetes (borderline)  Renal/GU negative Renal ROS  negative genitourinary   Musculoskeletal   Abdominal   Peds  Hematology negative hematology ROS (+)   Anesthesia Other Findings Past Medical History: No date: Anxiety No date: GERD (gastroesophageal reflux disease) No date: Hypertension No date: Sleep apnea     Comment:  inconsistent cpap use   Reproductive/Obstetrics negative OB ROS                             Anesthesia Physical Anesthesia Plan  ASA: 2  Anesthesia Plan: General   Post-op Pain Management:    Induction: Intravenous  PONV Risk Score and Plan: 2 and Ondansetron, Dexamethasone, Midazolam and Treatment may vary due to age or medical condition  Airway Management Planned: LMA and Oral ETT  Additional Equipment:   Intra-op Plan:   Post-operative Plan: Extubation in OR  Informed Consent: I have reviewed the patients  History and Physical, chart, labs and discussed the procedure including the risks, benefits and alternatives for the proposed anesthesia with the patient or authorized representative who has indicated his/her understanding and acceptance.     Dental Advisory Given  Plan Discussed with: Anesthesiologist, CRNA and Surgeon  Anesthesia Plan Comments:         Anesthesia Quick Evaluation

## 2021-03-31 NOTE — H&P (Signed)
Date of Initial H&P: 03/23/21  History reviewed, patient examined, no change in status, stable for surgery.

## 2021-04-04 NOTE — Anesthesia Postprocedure Evaluation (Addendum)
Anesthesia Post Note  Patient: Jeremy Gonzales  Procedure(s) Performed: CYSTOSCOPY  Patient location during evaluation: PACU Anesthesia Type: General Level of consciousness: awake and alert Pain management: pain level controlled Vital Signs Assessment: post-procedure vital signs reviewed and stable Respiratory status: spontaneous breathing, nonlabored ventilation, respiratory function stable and patient connected to nasal cannula oxygen Cardiovascular status: blood pressure returned to baseline and stable Postop Assessment: no apparent nausea or vomiting Anesthetic complications: no   No notable events documented.   Last Vitals:  Vitals:   03/31/21 0845 03/31/21 0852  BP: 124/84 127/83  Pulse: 77 77  Resp: 18 18  Temp: (!) 36.2 C (!) 36.3 C  SpO2: 98% 98%    Last Pain:  Vitals:   04/01/21 0915  TempSrc:   PainSc: 0-No pain                 Lenard Simmer

## 2021-04-04 NOTE — Addendum Note (Signed)
Addendum  created 04/04/21 0816 by Lenard Simmer, MD   Clinical Note Signed

## 2022-06-06 ENCOUNTER — Other Ambulatory Visit: Payer: Self-pay | Admitting: Urology

## 2022-06-06 DIAGNOSIS — N451 Epididymitis: Secondary | ICD-10-CM

## 2022-06-12 ENCOUNTER — Ambulatory Visit
Admission: RE | Admit: 2022-06-12 | Discharge: 2022-06-12 | Disposition: A | Discharge status: Home / Self Care | Payer: BC Managed Care – PPO | Source: Ambulatory Visit | Attending: Urology | Admitting: Urology

## 2022-06-12 DIAGNOSIS — N451 Epididymitis: Secondary | ICD-10-CM | POA: Diagnosis present

## 2022-07-11 ENCOUNTER — Encounter: Payer: Self-pay | Admitting: Urology

## 2022-07-11 ENCOUNTER — Ambulatory Visit: Payer: BC Managed Care – PPO | Admitting: Urology

## 2022-07-11 VITALS — BP 137/82 | HR 88 | Ht 73.0 in | Wt 256.0 lb

## 2022-07-11 DIAGNOSIS — N138 Other obstructive and reflux uropathy: Secondary | ICD-10-CM

## 2022-07-11 DIAGNOSIS — N503 Cyst of epididymis: Secondary | ICD-10-CM | POA: Diagnosis not present

## 2022-07-11 DIAGNOSIS — N400 Enlarged prostate without lower urinary tract symptoms: Secondary | ICD-10-CM

## 2022-07-11 DIAGNOSIS — N50812 Left testicular pain: Secondary | ICD-10-CM

## 2022-07-11 DIAGNOSIS — Z125 Encounter for screening for malignant neoplasm of prostate: Secondary | ICD-10-CM

## 2022-07-11 DIAGNOSIS — N401 Enlarged prostate with lower urinary tract symptoms: Secondary | ICD-10-CM

## 2022-07-11 LAB — BLADDER SCAN AMB NON-IMAGING

## 2022-07-11 LAB — MICROSCOPIC EXAMINATION

## 2022-07-11 LAB — URINALYSIS, COMPLETE
Bilirubin, UA: NEGATIVE
Glucose, UA: NEGATIVE
Ketones, UA: NEGATIVE
Nitrite, UA: NEGATIVE
Specific Gravity, UA: 1.03 (ref 1.005–1.030)
Urobilinogen, Ur: 0.2 mg/dL (ref 0.2–1.0)
pH, UA: 6 (ref 5.0–7.5)

## 2022-07-11 MED ORDER — ALFUZOSIN HCL ER 10 MG PO TB24: 10.0000 mg | ORAL_TABLET | Freq: Every day | ORAL | 3 refills | Status: AC

## 2022-07-11 NOTE — Progress Notes (Signed)
I, DeAsia L Maxie,acting as a scribe for Vanna Scotland, MD.,have documented all relevant documentation on the behalf of Vanna Scotland, MD,as directed by  Vanna Scotland, MD while in the presence of Vanna Scotland, MD.   07/11/22 11:01 AM   Jeremy Gonzales 09/27/1965 [id number]  Chief Complaint  Patient presents with   New Patient (Initial Visit)   Benign Prostatic Hypertrophy    HPI: 56 year-old male with personal history of BPH with lower urinary tract symptoms and epididymitis who's transferring care from Dr. Sheppard Penton.   His last epididymitis episode was October 2023, treated with antibiotics in support of care. He states his left testicle has always been larger than the right side. Scrotal ultrasound on 06/12/22 showed an 8 mm right epididymal cyst.  Worried about this today.  He wonders if he needs to have surgery for this.  It sometimes occasionally tender but other overall, not painful.  He has a history of Vasectomy 30 years ago.   He has longstanding history of urinary issues and BPH.  He had a workup for his urinary symptoms including a cystoscopy in the operating room in 2022 which showed 3+ bilobar hypertrophy, 1+ trabeculation. He's tried a number of medications such as Alfuzosin and Tadalafil. He had a prostate ultrasound last year with a volume of 29.6.   His most recent PSA was 0.72 on 05/25/20.  He tried Alfuzosin and Tadalafil for 4 days and did not notice much change with the urinary symptoms.  Ports that he stopped taking these medications because he was concerned about retrograde ejaculation and how that might affect him.    Results for orders placed or performed in visit on 07/11/22  Bladder Scan (Post Void Residual) in office  Result Value Ref Range   Scan Result      IPSS     Row Name 07/11/22 1000         International Prostate Symptom Score   How often have you had the sensation of not emptying your bladder? About half the time     How often have  you had to urinate less than every two hours? More than half the time     How often have you found you stopped and started again several times when you urinated? More than half the time     How often have you found it difficult to postpone urination? About half the time     How often have you had a weak urinary stream? More than half the time     How often have you had to strain to start urination? More than half the time     How many times did you typically get up at night to urinate? 3 Times     Total IPSS Score 25       Quality of Life due to urinary symptoms   If you were to spend the rest of your life with your urinary condition just the way it is now how would you feel about that? Mostly Disatisfied              Score:  1-7 Mild 8-19 Moderate 20-35 Severe   PMH: Past Medical History:  Diagnosis Date   Anxiety    GERD (gastroesophageal reflux disease)    Hypertension    Sleep apnea    inconsistent cpap use    Surgical History: Past Surgical History:  Procedure Laterality Date   ANAL FISSURECTOMY     COLONOSCOPY  CYSTOSCOPY N/A 03/31/2021   Procedure: CYSTOSCOPY;  Surgeon: Orson Ape, MD;  Location: [facility] ORS;  Service: Urology;  Laterality: N/A;   ESOPHAGOGASTRODUODENOSCOPY      Home Medications:  Allergies as of 07/11/2022       Reactions   Metoclopramide    Other reaction(s): Hallucination   Promethazine    Other reaction(s): Other (See Comments) Makes patient feel as if he is crawling out of his skin.   Penicillins Rash   Childhood allergy        Medication List        Accurate as of July 11, 2022 11:01 AM. If you have any questions, ask your nurse or doctor.          STOP taking these medications    cetirizine 10 MG tablet Commonly known as: ZYRTEC Stopped by: Vanna Scotland, MD   levofloxacin 500 MG tablet Commonly known as: Levaquin Stopped by: Vanna Scotland, MD   Uribel 118 MG Caps Stopped by: Vanna Scotland, MD        TAKE these medications    alfuzosin 10 MG 24 hr tablet Commonly known as: UROXATRAL Take 1 tablet (10 mg total) by mouth daily with breakfast. Started by: Vanna Scotland, MD   amLODipine 10 MG tablet Commonly known as: NORVASC Take 5 mg by mouth at bedtime.   LORazepam 0.5 MG tablet Commonly known as: ATIVAN Take 0.5 mg by mouth daily as needed for anxiety.   RABEprazole 20 MG tablet Commonly known as: ACIPHEX Take 20 mg by mouth every morning.   tadalafil 5 MG tablet Commonly known as: CIALIS Take 5 mg by mouth daily as needed for erectile dysfunction.        Allergies:  Allergies  Allergen Reactions   Metoclopramide     Other reaction(s): Hallucination   Promethazine     Other reaction(s): Other (See Comments) Makes patient feel as if he is crawling out of his skin.    Penicillins Rash    Childhood allergy    Social History:  reports that he has never smoked. He has never used smokeless tobacco. He reports that he does not currently use alcohol. He reports that he does not use drugs.   Physical Exam: BP 137/82   Pulse 88   Ht 6\' 1"  (1.854 m)   Wt 256 lb (116.1 kg)   BMI 33.78 kg/m   Constitutional:  Alert and oriented, No acute distress. HEENT: [place] AT, moist mucus membranes.  Trachea midline, no masses. GU: normal sphincter, 30 cc prostate, non-tender, no nodules. He had a circumcised phallus, bilateral descended testicles. He had a small less than one centimeter left extra testicular cyst that was very mildly tender. He had a normal testicle and epididymis bilaterally, no other mass is palpable.  Neurologic: Grossly intact, no focal deficits, moving all 4 extremities. Psychiatric: Normal mood and affect.  Pertinent Imaging: EXAM: SCROTAL ULTRASOUND   DOPPLER ULTRASOUND OF THE TESTICLES   TECHNIQUE: Complete ultrasound examination of the testicles, epididymis, and other scrotal structures was performed. Color and spectral Doppler ultrasound  were also utilized to evaluate blood flow to the testicles.   COMPARISON:  Scrotal ultrasound dated 01/30/2008.   FINDINGS: Right testicle   Measurements: 4.6 x 2.3 x 3.4 cm. No mass or microlithiasis visualized.   Left testicle   Measurements: 4.2 x 2.0 x 2.7 cm. No mass or microlithiasis visualized.   Right epididymis:  An epididymal cyst measures 8 x 7 x 7 mm.  Left epididymis:  Normal in size and appearance.   Hydrocele:  None visualized.   Varicocele:  None visualized.   Pulsed Doppler interrogation of both testes demonstrates normal low resistance arterial and venous waveforms bilaterally.   IMPRESSION: 1. No evidence epididymitis. 2. A 8 mm right epididymal cyst.     Electronically Signed   By: Romona Curls M.D.   On: 06/13/2022 10:21  Personally reviewed and agrees with radiologic interpretation.   Assessment & Plan:    BPH with urinary obstruction - Symptomatic - He'd like to try Tadalafil and Alfuzosin. We discussed specifically retrograde ejaculation today. He's very concerned about if it's not effective, he will consider Urolift.  -Meds as above prescribed, he will let us know in about a month whether or not he would like to discuss alternative options  2. Left testicular pain/Left epididymal cyst - Incidental cyst, would not recommend intervention  3. Screening for PSA - PSA checked today, rectal exam unremarkable  Return in about 1 year (around 07/12/2023).   [facility] [address] [place], [place] [address] [phone]   I have reviewed the above documentation for accuracy and completeness, and I agree with the above.   Vanna Scotland, MD

## 2022-07-12 ENCOUNTER — Telehealth: Payer: Self-pay

## 2022-07-12 LAB — PSA: Prostate Specific Ag, Serum: 1.1 ng/mL (ref 0.0–4.0)

## 2022-07-12 NOTE — Telephone Encounter (Signed)
Patient advised and will hold off on doing another urine sample at this time.

## 2022-07-12 NOTE — Telephone Encounter (Signed)
Patient is asking about his urinalysis from 07/11/22, he was not happy with the results that he saw on mychart. He was concerned with the abnormal levels and would like to know what that means or could mean. Patient did ask to hear from Dr Jeremy Gonzales directly if possible

## 2022-07-12 NOTE — Telephone Encounter (Signed)
In my professional opinion, I do not feel that this is concerning.  He did not express to me any symptoms of infection and his scrotal exam was benign.  I did not send a urine culture because I did not feel that this was clinically consistent with infection.  If he is having burning urgency or frequency which he feels may be a UTI, he can come in and give another sample and we can send culture.  I think that is very unlikely that he will grow any bacteria in the absence of symptoms.  Vanna Scotland, MD

## 2022-08-29 ENCOUNTER — Ambulatory Visit: Payer: BC Managed Care – PPO | Admitting: Gastroenterology

## 2022-08-29 ENCOUNTER — Encounter: Payer: Self-pay | Admitting: Gastroenterology

## 2022-08-29 VITALS — BP 125/84 | HR 73 | Temp 98.4°F | Wt 252.0 lb

## 2022-08-29 DIAGNOSIS — K2270 Barrett's esophagus without dysplasia: Secondary | ICD-10-CM

## 2022-08-29 DIAGNOSIS — K219 Gastro-esophageal reflux disease without esophagitis: Secondary | ICD-10-CM | POA: Diagnosis not present

## 2022-08-29 NOTE — Progress Notes (Signed)
Gastroenterology Consultation  Referring Provider:     No ref. provider found Primary Care Physician:  Pcp, No Primary Gastroenterologist:  Dr. Allen Norris     Reason for Consultation:     Abdominal pain with bloating        HPI:   Jeremy Gonzales is a 57 y.o. y/o male referred for consultation & management of abdominal pain with bloating by Dr. Merryl Hacker, No.  This patient comes in today after seeing me in the past with a history of being followed at [facility].  The patient states that he has constipation with bloating and the patient has been given Carafate and MiraLAX which he states has helped somewhat but he does better on his stool softener.  The patient was also sent home with dicyclomine from his previous gastroenterologist but the patient did not want to take it because he was not sure of its effects and wanted to talk to me about it first.  The patient also reports that he has chronic burping.  There is no report of any black stools or bloody stools.  Patient's last colonoscopy was in 2016 and it was normal except for hyperplastic polyp and he was told that he needs a repeat colonoscopy in 10 years.  Patient reports that he takes Zofran intermittently for his nausea and reports that his abdominal bloating is more in the morning but can be throughout the entire day.  He reports that he has been under a lot of stress and has a history of losing a son when son was in college.  The patient had a gastric emptying study back in 2019 that was normal.  Past Medical History:  Diagnosis Date   Anxiety    GERD (gastroesophageal reflux disease)    Hypertension    Sleep apnea    inconsistent cpap use    Past Surgical History:  Procedure Laterality Date   ANAL FISSURECTOMY     COLONOSCOPY     CYSTOSCOPY N/A 03/31/2021   Procedure: CYSTOSCOPY;  Surgeon: Royston Cowper, MD;  Location: [facility] ORS;  Service: Urology;  Laterality: N/A;   ESOPHAGOGASTRODUODENOSCOPY      Prior to Admission medications   Medication  Sig Start Date End Date Taking? Authorizing Provider  alfuzosin (UROXATRAL) 10 MG 24 hr tablet Take 1 tablet (10 mg total) by mouth daily with breakfast. 07/11/22  Yes Hollice Espy, MD  amLODipine (NORVASC) 10 MG tablet Take 5 mg by mouth at bedtime. 01/10/18  Yes [provider]  LORazepam (ATIVAN) 0.5 MG tablet Take 0.5 mg by mouth daily as needed for anxiety. 01/11/18  Yes [provider]  RABEprazole (ACIPHEX) 20 MG tablet Take 20 mg by mouth every morning. 01/29/18  Yes [provider]  tadalafil (CIALIS) 5 MG tablet Take 5 mg by mouth daily as needed for erectile dysfunction.   Yes [provider]    No family history on file.   Social History   Tobacco Use   Smoking status: Never   Smokeless tobacco: Never  Vaping Use   Vaping Use: Never used  Substance Use Topics   Alcohol use: Not Currently    Comment: social   Drug use: Never    Allergies as of 08/29/2022 - Review Complete 08/29/2022  Allergen Reaction Noted   Metoclopramide  07/08/2017   Promethazine  07/08/2017   Penicillins Rash 10/16/2017    Review of Systems:    All systems reviewed and negative except where noted in HPI.  Physical Exam:  BP 125/84 (BP Location: Left Arm, Patient Position: Sitting, Cuff Size: Large)   Pulse 73   Temp 98.4 F (36.9 C) (Oral)   Wt 252 lb (114.3 kg)   BMI 33.25 kg/m  No LMP for male patient. General:   Alert,  Well-developed, well-nourished, pleasant and cooperative in NAD Head:  Normocephalic and atraumatic. Eyes:  Sclera clear, no icterus.   Conjunctiva pink. Ears:  Normal auditory acuity. Neck:  Supple; no masses or thyromegaly. Lungs:  Respirations even and unlabored.  Clear throughout to auscultation.   No wheezes, crackles, or rhonchi. No acute distress. Heart:  Regular rate and rhythm; no murmurs, clicks, rubs, or gallops. Abdomen:  Normal bowel sounds.  No bruits.  Soft, non-tender and non-distended without masses,  hepatosplenomegaly or hernias noted.  No guarding or rebound tenderness.  Negative Carnett sign.   Rectal:  Deferred.  Pulses:  Normal pulses noted. Extremities:  No clubbing or edema.  No cyanosis. Neurologic:  Alert and oriented x3;  grossly normal neurologically. Skin:  Intact without significant lesions or rashes.  No jaundice. Lymph Nodes:  No significant cervical adenopathy. Psych:  Alert and cooperative. Normal mood and affect.  Imaging Studies: No results found.  Assessment and Plan:   Jeremy Gonzales is a 57 y.o. y/o male who comes to me after being seen by me in the past then was followed at [facility] and now wants to return his care to me now.  The patient has signs and symptoms consistent with irritable bowel syndrome with constipation predominance.  The patient has been explained the pathophysiology of the bloating and the other GI symptoms.  The patient will be set up for an upper endoscopy due to his a history of Barrett's esophagus.  The patient is not due for colonoscopy at this time.  The patient has also been encouraged to start the tricyclic antidepressant he was given but had not been started yet.  He has also been told about a low FODMAP diet.  The patient has been explained the plan agrees with it.    Lucilla Lame, MD. Marval Regal    Note: This dictation was prepared with Dragon dictation along with smaller phrase technology. Any transcriptional errors that result from this process are unintentional.

## 2022-09-04 ENCOUNTER — Other Ambulatory Visit: Payer: Self-pay

## 2022-09-04 MED ORDER — ONDANSETRON 4 MG PO TBDP: 4.0000 mg | ORAL_TABLET | Freq: Three times a day (TID) | ORAL | 1 refills | Status: AC | PRN

## 2022-09-04 MED ORDER — RABEPRAZOLE SODIUM 20 MG PO TBEC: 20.0000 mg | DELAYED_RELEASE_TABLET | ORAL | 1 refills | Status: DC

## 2022-09-04 NOTE — Telephone Encounter (Signed)
Pt called requesting refill of Zofran and Rabeprazole BID... I did not see any mention of medications in your notes... After reviewing historical back to 2017, I did not see where the Rabeprazole was ever BID.... Please advise if okay to refill both Rx.

## 2022-09-05 ENCOUNTER — Encounter: Payer: Self-pay | Admitting: Gastroenterology

## 2022-09-08 ENCOUNTER — Encounter
Admission: RE | Disposition: A | Discharge status: Home / Self Care | Payer: Self-pay | Source: Home / Self Care | Attending: Gastroenterology

## 2022-09-08 ENCOUNTER — Other Ambulatory Visit: Payer: Self-pay

## 2022-09-08 ENCOUNTER — Ambulatory Visit: Payer: BC Managed Care – PPO | Admitting: Anesthesiology

## 2022-09-08 ENCOUNTER — Ambulatory Visit
Admission: RE | Admit: 2022-09-08 | Discharge: 2022-09-08 | Disposition: A | Discharge status: Home / Self Care | Payer: BC Managed Care – PPO | Attending: Gastroenterology | Admitting: Gastroenterology

## 2022-09-08 ENCOUNTER — Encounter: Payer: Self-pay | Admitting: Gastroenterology

## 2022-09-08 DIAGNOSIS — G4730 Sleep apnea, unspecified: Secondary | ICD-10-CM | POA: Diagnosis not present

## 2022-09-08 DIAGNOSIS — F419 Anxiety disorder, unspecified: Secondary | ICD-10-CM | POA: Insufficient documentation

## 2022-09-08 DIAGNOSIS — K2270 Barrett's esophagus without dysplasia: Secondary | ICD-10-CM

## 2022-09-08 DIAGNOSIS — K219 Gastro-esophageal reflux disease without esophagitis: Secondary | ICD-10-CM | POA: Diagnosis not present

## 2022-09-08 DIAGNOSIS — I10 Essential (primary) hypertension: Secondary | ICD-10-CM | POA: Insufficient documentation

## 2022-09-08 HISTORY — DX: Prediabetes: R73.03

## 2022-09-08 HISTORY — PX: ESOPHAGOGASTRODUODENOSCOPY (EGD) WITH PROPOFOL: SHX5813

## 2022-09-08 HISTORY — DX: Unspecified osteoarthritis, unspecified site: M19.90

## 2022-09-08 SURGERY — ESOPHAGOGASTRODUODENOSCOPY (EGD) WITH PROPOFOL
Anesthesia: General | Site: Mouth

## 2022-09-08 MED ORDER — GLYCOPYRROLATE 0.2 MG/ML IJ SOLN
INTRAMUSCULAR | Status: DC | PRN
  Administered 2022-09-08: .2 mg via INTRAVENOUS

## 2022-09-08 MED ORDER — SODIUM CHLORIDE 0.9 % IV SOLN: INTRAVENOUS | Status: DC

## 2022-09-08 MED ORDER — PROPOFOL 10 MG/ML IV BOLUS
INTRAVENOUS | Status: DC | PRN
  Administered 2022-09-08: 100 mg via INTRAVENOUS
  Administered 2022-09-08 (×2): 50 mg via INTRAVENOUS

## 2022-09-08 MED ORDER — LACTATED RINGERS IV SOLN: INTRAVENOUS | Status: DC

## 2022-09-08 SURGICAL SUPPLY — 32 items

## 2022-09-08 NOTE — Anesthesia Preprocedure Evaluation (Signed)
Anesthesia Evaluation  Patient identified by MRN, date of birth, ID band Patient awake    Reviewed: Allergy & Precautions, NPO status , Patient's Chart, lab work & pertinent test results  History of Anesthesia Complications Negative for: history of anesthetic complications  Airway Mallampati: III  TM Distance: >3 FB Neck ROM: full    Dental  (+) Chipped   Pulmonary sleep apnea , neg COPD   Pulmonary exam normal        Cardiovascular hypertension, On Medications (-) angina (-) Past MI and (-) CABG negative cardio ROS Normal cardiovascular exam     Neuro/Psych  PSYCHIATRIC DISORDERS Anxiety     negative neurological ROS     GI/Hepatic Neg liver ROS,GERD  ,,  Endo/Other  negative endocrine ROS    Renal/GU negative Renal ROS  negative genitourinary   Musculoskeletal   Abdominal   Peds  Hematology negative hematology ROS (+)   Anesthesia Other Findings Past Medical History: No date: Anxiety No date: Arthritis No date: GERD (gastroesophageal reflux disease) No date: Hypertension No date: Pre-diabetes No date: Sleep apnea     Comment:  inconsistent cpap use  Past Surgical History: No date: ANAL FISSURECTOMY No date: COLONOSCOPY 03/31/2021: CYSTOSCOPY; N/A     Comment:  Procedure: CYSTOSCOPY;  Surgeon: Royston Cowper, MD;                Location: [facility] ORS;  Service: Urology;  Laterality: N/A; No date: ESOPHAGOGASTRODUODENOSCOPY  BMI    Body Mass Index: 32.72 kg/m      Reproductive/Obstetrics negative OB ROS                             Anesthesia Physical Anesthesia Plan  ASA: 2  Anesthesia Plan: General   Post-op Pain Management: Minimal or no pain anticipated   Induction: Intravenous  PONV Risk Score and Plan: 3 and Propofol infusion, TIVA and Ondansetron  Airway Management Planned: Nasal Cannula  Additional Equipment: None  Intra-op Plan:   Post-operative Plan:    Informed Consent: I have reviewed the patients History and Physical, chart, labs and discussed the procedure including the risks, benefits and alternatives for the proposed anesthesia with the patient or authorized representative who has indicated his/her understanding and acceptance.     Dental advisory given  Plan Discussed with: CRNA and Surgeon  Anesthesia Plan Comments: (Discussed risks of anesthesia with patient, including possibility of difficulty with spontaneous ventilation under anesthesia necessitating airway intervention, PONV, and rare risks such as cardiac or respiratory or neurological events, and allergic reactions. Discussed the role of CRNA in patient's perioperative care. Patient understands.)       Anesthesia Quick Evaluation

## 2022-09-08 NOTE — Transfer of Care (Signed)
Immediate Anesthesia Transfer of Care Note  Patient: Jeremy Gonzales  Procedure(s) Performed: ESOPHAGOGASTRODUODENOSCOPY (EGD) WITH PROPOFOL (Mouth)  Patient Location: PACU  Anesthesia Type: General  Level of Consciousness: awake, alert  and patient cooperative  Airway and Oxygen Therapy: Patient Spontanous Breathing and Patient connected to supplemental oxygen  Post-op Assessment: Post-op Vital signs reviewed, Patient's Cardiovascular Status Stable, Respiratory Function Stable, Patent Airway and No signs of Nausea or vomiting  Post-op Vital Signs: Reviewed and stable  Complications: No notable events documented.

## 2022-09-08 NOTE — Op Note (Signed)
[facility] Gastroenterology Patient Name: Jeremy Gonzales Procedure Date: 09/08/2022 11:07 AM MRN: [id number] Account #: [id number] Date of Birth: 1966/02/21 Admit Type: Outpatient Age: 57 Room: [facility] OR ROOM 01 Gender: Male Note Status: Finalized Instrument Name: [id number] Procedure:             Upper GI endoscopy Indications:           Surveillance for malignancy due to personal history of                         Barrett's esophagus Providers:             Lucilla Lame MD, MD Referring MD:          [place] Medicines:             Propofol per Anesthesia Complications:         No immediate complications. Procedure:             Pre-Anesthesia Assessment:                        - Prior to the procedure, a History and Physical was                         performed, and patient medications and allergies were                         reviewed. The patient's tolerance of previous                         anesthesia was also reviewed. The risks and benefits                         of the procedure and the sedation options and risks                         were discussed with the patient. All questions were                         answered, and informed consent was obtained. Prior                         Anticoagulants: The patient has taken no anticoagulant                         or antiplatelet agents. ASA Grade Assessment: II - A                         patient with mild systemic disease. After reviewing                         the risks and benefits, the patient was deemed in                         satisfactory condition to undergo the procedure.                        After obtaining informed consent, the endoscope was  passed under direct vision. Throughout the procedure,                         the patient's blood pressure, pulse, and oxygen                         saturations were monitored continuously. The Endoscope                          was introduced through the mouth, and advanced to the                         second part of duodenum. The upper GI endoscopy was                         accomplished without difficulty. The patient tolerated                         the procedure well. Findings:      One tongue of salmon-colored mucosa was present from 40 to 41 cm.       Squamous islands were present at 38 cm. The maximum longitudinal extent       of these esophageal mucosal changes was 1 cm in length. Biopsies were       taken with a cold forceps for histology.      The stomach was normal.      The examined duodenum was normal. Impression:            - Salmon-colored mucosa. Biopsied.                        - Normal stomach.                        - Normal examined duodenum. Recommendation:        - Discharge patient to home.                        - Resume previous diet.                        - Continue present medications.                        - Await pathology results.                        - Repeat upper endoscopy in 3 years for surveillance. Procedure Code(s):     --- Professional ---                        [id number], Esophagogastroduodenoscopy, flexible,                         transoral; with biopsy, single or multiple Diagnosis Code(s):     --- Professional ---                        K22.70, Barrett's esophagus without dysplasia CPT copyright 2022 American Medical Association. All rights reserved. The codes documented in this report are preliminary and upon coder review may  be revised to  meet current compliance requirements. Lucilla Lame MD, MD 09/08/2022 11:27:42 AM This report has been signed electronically. Number of Addenda: 0 Note Initiated On: 09/08/2022 11:07 AM Total Procedure Duration: 0 hours 2 minutes 52 seconds  Estimated Blood Loss:  Estimated blood loss: none.      [facility]

## 2022-09-08 NOTE — Anesthesia Postprocedure Evaluation (Signed)
Anesthesia Post Note  Patient: Jeremy Gonzales  Procedure(s) Performed: ESOPHAGOGASTRODUODENOSCOPY (EGD) WITH PROPOFOL (Mouth)  Patient location during evaluation: PACU Anesthesia Type: General Level of consciousness: awake and alert Pain management: pain level controlled Vital Signs Assessment: post-procedure vital signs reviewed and stable Respiratory status: spontaneous breathing, nonlabored ventilation, respiratory function stable and patient connected to nasal cannula oxygen Cardiovascular status: blood pressure returned to baseline and stable Postop Assessment: no apparent nausea or vomiting Anesthetic complications: no  No notable events documented.   Last Vitals:  Vitals:   09/08/22 1129 09/08/22 1137  BP: 139/89 (!) 140/88  Pulse: 90 90  Resp: 14 (!) 25  Temp: 36.9 C 36.9 C  SpO2: 93% 95%    Last Pain:  Vitals:   09/08/22 1137  PainSc: 0-No pain                 Dimas Millin

## 2022-09-08 NOTE — H&P (Signed)
Lucilla Lame, MD [facility] [address] [place], [place] [address] Phone:[phone] Fax : [phone]  Primary Care Physician:  [facility] Primary Gastroenterologist:  Dr. Allen Norris  Pre-Procedure History & Physical: HPI:  Jeremy Gonzales is a 57 y.o. male is here for an endoscopy.   Past Medical History:  Diagnosis Date   Anxiety    Arthritis    GERD (gastroesophageal reflux disease)    Hypertension    Pre-diabetes    Sleep apnea    inconsistent cpap use    Past Surgical History:  Procedure Laterality Date   ANAL FISSURECTOMY     COLONOSCOPY     CYSTOSCOPY N/A 03/31/2021   Procedure: CYSTOSCOPY;  Surgeon: Royston Cowper, MD;  Location: [facility] ORS;  Service: Urology;  Laterality: N/A;   ESOPHAGOGASTRODUODENOSCOPY      Prior to Admission medications   Medication Sig Start Date End Date Taking? Authorizing Provider  alfuzosin (UROXATRAL) 10 MG 24 hr tablet Take 1 tablet (10 mg total) by mouth daily with breakfast. 07/11/22  Yes Hollice Espy, MD  amLODipine (NORVASC) 10 MG tablet Take 5 mg by mouth at bedtime. 01/10/18  Yes [provider]  LORazepam (ATIVAN) 0.5 MG tablet Take 0.5 mg by mouth daily as needed for anxiety. 01/11/18  Yes [provider]  Magnesium Hydroxide (DULCOLAX PO) Take by mouth daily.   Yes [provider]  ondansetron (ZOFRAN-ODT) 4 MG disintegrating tablet Take 1 tablet (4 mg total) by mouth every 8 (eight) hours as needed. 09/04/22  Yes Lucilla Lame, MD  Peppermint Oil (IBGARD PO) Take by mouth as needed.   Yes [provider]  RABEprazole (ACIPHEX) 20 MG tablet Take 1 tablet (20 mg total) by mouth every morning. Patient taking differently: Take 20 mg by mouth in the morning and at bedtime. 09/04/22  Yes Lucilla Lame, MD  rosuvastatin (CRESTOR) 40 MG tablet Take 40 mg by mouth daily.   Yes [provider]  sucralfate (CARAFATE) 1 g tablet Take 1 g by mouth 2 (two) times daily.   Yes [provider]  tadalafil (CIALIS) 5 MG tablet Take 5 mg by mouth daily as needed for erectile dysfunction.   Yes [provider]    Allergies as of 08/29/2022 - Review Complete 08/29/2022  Allergen Reaction Noted   Metoclopramide  07/08/2017   Promethazine  07/08/2017   Penicillins Rash 10/16/2017    History reviewed. No pertinent family history.  Social History   Socioeconomic History   Marital status: Married    Spouse name: Not on file   Number of children: Not on file   Years of education: Not on file   Highest education level: Not on file  Occupational History   Not on file  Tobacco Use   Smoking status: Never   Smokeless tobacco: Never  Vaping Use   Vaping Use: Never used  Substance and Sexual Activity   Alcohol use: Not Currently    Comment: social   Drug use: Never   Sexual activity: Yes  Other Topics Concern   Not on file  Social History Narrative   Not on file   Social Determinants of Health   Financial Resource Strain: Not on file  Food Insecurity: Not on file  Transportation Needs: Not on file  Physical Activity: Not on file  Stress: Not on file  Social Connections: Not on file  Intimate Partner Violence: Not on file    Review of Systems: See HPI, otherwise negative ROS  Physical Exam: BP (!) 147/85   Pulse 70   Temp 99 F (37.2 C)   Resp 18   Ht 6' 1"$  (1.854 m)   Wt 112.5 kg   SpO2 96%   BMI 32.72 kg/m  General:   Alert,  pleasant and cooperative in NAD Head:  Normocephalic and atraumatic. Neck:  Supple; no masses or thyromegaly. Lungs:  Clear throughout to auscultation.    Heart:  Regular rate and rhythm. Abdomen:  Soft, nontender and nondistended. Normal bowel sounds, without guarding, and without rebound.   Neurologic:  Alert and  oriented x4;  grossly normal neurologically.  Impression/Plan: Jeremy Gonzales is here for an endoscopy to be performed for Barrett's  Risks, benefits, limitations, and alternatives regarding   endoscopy have been reviewed with the patient.  Questions have been answered.  All parties agreeable.   Lucilla Lame, MD  09/08/2022, 10:39 AM

## 2022-09-11 ENCOUNTER — Encounter: Payer: Self-pay | Admitting: Gastroenterology

## 2022-09-12 ENCOUNTER — Encounter: Payer: Self-pay | Admitting: Gastroenterology

## 2022-09-12 LAB — SURGICAL PATHOLOGY

## 2023-02-25 ENCOUNTER — Other Ambulatory Visit: Payer: Self-pay | Admitting: Gastroenterology

## 2023-07-05 ENCOUNTER — Other Ambulatory Visit: Payer: BC Managed Care – PPO

## 2023-07-05 ENCOUNTER — Other Ambulatory Visit: Payer: Self-pay

## 2023-07-05 DIAGNOSIS — N400 Enlarged prostate without lower urinary tract symptoms: Secondary | ICD-10-CM

## 2023-07-10 ENCOUNTER — Ambulatory Visit: Payer: BC Managed Care – PPO | Admitting: Urology

## 2023-07-10 DIAGNOSIS — N400 Enlarged prostate without lower urinary tract symptoms: Secondary | ICD-10-CM

## 2023-07-10 DIAGNOSIS — Z125 Encounter for screening for malignant neoplasm of prostate: Secondary | ICD-10-CM

## 2023-08-31 ENCOUNTER — Other Ambulatory Visit: Payer: Self-pay | Admitting: Gastroenterology

## 2024-05-06 ENCOUNTER — Telehealth: Payer: Self-pay

## 2024-05-06 NOTE — Telephone Encounter (Signed)
 Pt call c/o of possible hemorrhoids... noticed x 1 week... Pt wanted to be seen today.... No available appointments... Pt advised to reach out to PCP to see if he can be evaluated... In the meantime, advised pt to try OTC steroid cream or suppositories, also SITZ bath and increase fiber, water intake... Pt is aware that if hemorrhoids are severely thrombosed, painful and swollen, it would be a surgical intervention needed... Pt expressed understanding and said he would reach out to PCP

## 2024-05-22 ENCOUNTER — Ambulatory Visit (INDEPENDENT_AMBULATORY_CARE_PROVIDER_SITE_OTHER): Admitting: Student

## 2024-05-22 ENCOUNTER — Ambulatory Visit (INDEPENDENT_AMBULATORY_CARE_PROVIDER_SITE_OTHER)

## 2024-05-22 DIAGNOSIS — M79601 Pain in right arm: Secondary | ICD-10-CM

## 2024-05-22 DIAGNOSIS — M7021 Olecranon bursitis, right elbow: Secondary | ICD-10-CM | POA: Diagnosis not present

## 2024-05-22 DIAGNOSIS — M25521 Pain in right elbow: Secondary | ICD-10-CM

## 2024-05-22 MED ORDER — SULFAMETHOXAZOLE-TRIMETHOPRIM 800-160 MG PO TABS: 1.0000 | ORAL_TABLET | Freq: Two times a day (BID) | ORAL | 0 refills | Status: AC

## 2024-05-22 NOTE — Progress Notes (Signed)
 Chief Complaint: Right elbow pain     History of Present Illness:    Jeremy Gonzales is a 58 y.o. male who presents today for evaluation of pain and swelling in his right elbow.  Patient reports that this began about 1 week ago with no known injury.  He did state that initially there was a small area over the elbow with a small drainage pus that he thought may have been an insect or spider bite.  Was seen in orthopedic clinic 5 days ago and had the olecranon bursa aspirated.  He was started on Bactrim DS as a precaution.  Today he states that fluid has returned and feels warm to touch.  He denies any drainage, fever, or chills.  No significant difficulty with elbow range of motion.   Surgical History:   None  PMH/PSH/Family History/Social History/Meds/Allergies:    Past Medical History:  Diagnosis Date   Anxiety    Arthritis    GERD (gastroesophageal reflux disease)    Hypertension    Pre-diabetes    Sleep apnea    inconsistent cpap use   Past Surgical History:  Procedure Laterality Date   ANAL FISSURECTOMY     COLONOSCOPY     CYSTOSCOPY N/A 03/31/2021   Procedure: CYSTOSCOPY;  Surgeon: Kassie Ozell SAUNDERS, MD;  Location: [facility];  Service: Urology;  Laterality: N/A;   ESOPHAGOGASTRODUODENOSCOPY     ESOPHAGOGASTRODUODENOSCOPY (EGD) WITH PROPOFOL  N/A 09/08/2022   Procedure: ESOPHAGOGASTRODUODENOSCOPY (EGD) WITH PROPOFOL ;  Surgeon: Jinny Carmine, MD;  Location: [facility];  Service: Endoscopy;  Laterality: N/A;  Sleep apnea   Social History   Socioeconomic History   Marital status: Married    Spouse name: Not on file   Number of children: Not on file   Years of education: Not on file   Highest education level: Not on file  Occupational History   Not on file  Tobacco Use   Smoking status: Never   Smokeless tobacco: Never  Vaping Use   Vaping status: Never Used  Substance and Sexual Activity   Alcohol use: Not Currently     Comment: social   Drug use: Never   Sexual activity: Yes  Other Topics Concern   Not on file  Social History Narrative   Not on file   Social Drivers of Health   Financial Resource Strain: Low Risk  (05/04/2024)   Received from [facility]   Overall Financial Resource Strain (CARDIA)    Difficulty of Paying Living Expenses: Not hard at all  Food Insecurity: No Food Insecurity (05/04/2024)   Received from [facility]   Hunger Vital Sign    Within the past 12 months, you worried that your food would run out before you got the money to buy more.: Never true    Within the past 12 months, the food you bought just didn't last and you didn't have money to get more.: Never true  Transportation Needs: Unknown (05/04/2024)   Received from [facility] - Transportation    In the past 12 months, has lack of transportation kept you from medical appointments or from getting medications?: No    Lack of Transportation (Non-Medical): Not on file  Physical Activity: Insufficiently Active (02/02/2021)   Received from [facility]  Exercise Vital Sign    On average, how many days per week do you engage in moderate to strenuous exercise (like a brisk walk)?: 1 day    On average, how many minutes do you engage in exercise at this level?: 20 min  Stress: Stress Concern Present (02/02/2021)   Received from [facility] Occupational [facility] - Occupational Stress Questionnaire    Feeling of Stress : Very much  Social Connections: Unknown (11/02/2022)   Received from [facility]   Social Network    Social Network: Not on file   No family history on file. Allergies  Allergen Reactions   Metoclopramide     Other reaction(s): Hallucination   Promethazine     Other reaction(s): Makes patient feel as if he is crawling out of his skin.    Penicillins Rash    Childhood allergy   Current Outpatient Medications   Medication Sig Dispense Refill   alfuzosin  (UROXATRAL ) 10 MG 24 hr tablet Take 1 tablet (10 mg total) by mouth daily with breakfast. 90 tablet 3   amLODipine  (NORVASC ) 10 MG tablet Take 5 mg by mouth at bedtime.  0   LORazepam (ATIVAN) 0.5 MG tablet Take 0.5 mg by mouth daily as needed for anxiety.  0   Magnesium Hydroxide (DULCOLAX PO) Take by mouth daily.     ondansetron  (ZOFRAN -ODT) 4 MG disintegrating tablet Take 1 tablet (4 mg total) by mouth every 8 (eight) hours as needed. 20 tablet 1   Peppermint Oil (IBGARD PO) Take by mouth as needed.     RABEprazole  (ACIPHEX ) 20 MG tablet TAKE 1 TABLET BY MOUTH EVERY DAY IN THE MORNING 30 tablet 2   rosuvastatin (CRESTOR) 40 MG tablet Take 40 mg by mouth daily.     sucralfate (CARAFATE) 1 g tablet Take 1 g by mouth 2 (two) times daily.     tadalafil (CIALIS) 5 MG tablet Take 5 mg by mouth daily as needed for erectile dysfunction.     No current facility-administered medications for this visit.   No results found.  Review of Systems:   A ROS was performed including pertinent positives and negatives as documented in the HPI.  Physical Exam :   Constitutional: NAD and appears stated age Neurological: Alert and oriented Psych: Appropriate affect and cooperative There were no vitals taken for this visit.   Comprehensive Musculoskeletal Exam:    Moderate-sized area of fluctuant swelling noted over the right olecranon.  There is some warmth with with palpation throughout this area but no significant erythema.  Patient does demonstrate full range of motion of the elbow from 0 to 120 degrees.  Distal neurosensory exam is intact.  Imaging:   Xray (right elbow 3 views): No acute bony abnormality.  Soft tissue swelling over the olecranon suggestive of bursitis.  Small effusion.  Triceps enthesophyte.   I personally reviewed and interpreted the radiographs.   Assessment:   58 y.o. male with 1 week history of right elbow pain and swelling  consistent with olecranon bursitis.  Discussed that I am suspicious that this is more inflammatory in nature, however unable to rule out possible septic bursitis and would like to evaluate this further.  He did have this aspirated 5 days ago and states that the fluid appeared bloody in nature.  There is notable fluctuant swelling again today therefore I recommended to repeat aspiration.  20 cc of blood-tinged fluid was successfully aspirated and compressive wrap was applied.  Will  plan to extend patient on Bactrim DS for another few days while we await lab results for cell counts, culture and Gram stain, and crystals.  Should symptoms continue and lab results are negative, would likely consider addition of anti-inflammatory or oral steroid, with consideration of cortisone injection.  Will call patient with results once these are obtained.  Plan :    - Right elbow olecranon bursa aspiration performed today and sent for lab analysis - Follow-up via telephone on lab results for review and treatment plan    Procedure Note  Patient: Jeremy Gonzales             Date of Birth: 1965-11-04           MRN: [id number]             Visit Date: 05/22/2024  Procedures: Visit Diagnoses:  1. Olecranon bursitis, right elbow     Medium Joint Inj: R olecranon bursa on 05/22/2024 12:03 PM Details: 18 G 1.5 in needle, posterior approach Aspirate: 20 mL blood-tinged and cloudy; sent for lab analysis Outcome: tolerated well, no immediate complications Procedure, treatment alternatives, risks and benefits explained, specific risks discussed. Consent was given by the patient. Immediately prior to procedure a time out was called to verify the correct patient, procedure, equipment, support staff and site/side marked as required. Patient was prepped and draped in the usual sterile fashion.       I personally saw and evaluated the patient, and participated in the management and treatment plan.  Leonce Reveal,  PA-C Orthopedics

## 2024-05-23 ENCOUNTER — Encounter (HOSPITAL_BASED_OUTPATIENT_CLINIC_OR_DEPARTMENT_OTHER): Payer: Self-pay

## 2024-05-26 ENCOUNTER — Encounter: Payer: Self-pay | Admitting: Radiology

## 2024-05-26 NOTE — Telephone Encounter (Signed)
 Called patient to discuss results, no signs of infection therefore he can discontinue current antibiotics.  Patient was seen over the weekend at another clinic and was started on an oral steroid after preliminary results were reviewed.  He is beginning to feel a bit better.  Can return as needed.

## 2024-06-02 ENCOUNTER — Encounter (HOSPITAL_BASED_OUTPATIENT_CLINIC_OR_DEPARTMENT_OTHER): Payer: Self-pay

## 2024-06-02 ENCOUNTER — Ambulatory Visit (HOSPITAL_BASED_OUTPATIENT_CLINIC_OR_DEPARTMENT_OTHER): Admitting: Student

## 2024-06-02 DIAGNOSIS — M7021 Olecranon bursitis, right elbow: Secondary | ICD-10-CM | POA: Diagnosis not present

## 2024-06-02 DIAGNOSIS — M25521 Pain in right elbow: Secondary | ICD-10-CM | POA: Diagnosis not present

## 2024-06-02 MED ORDER — LIDOCAINE HCL 1 % IJ SOLN
2.0000 mL | INTRAMUSCULAR | Status: AC | PRN
  Administered 2024-06-02: 2 mL

## 2024-06-02 MED ORDER — TRIAMCINOLONE ACETONIDE 40 MG/ML IJ SUSP
2.0000 mL | INTRAMUSCULAR | Status: AC | PRN
  Administered 2024-06-02: 2 mL via INTRA_ARTICULAR

## 2024-06-02 NOTE — Progress Notes (Signed)
 Chief Complaint: Right elbow pain     History of Present Illness:   06/02/24: Patient presents today for follow-up evaluation of his right elbow.  He was seen in clinic on 10/30 for an aspiration of right olecranon bursitis.  Synovial fluid analysis demonstrated a negative culture without elevated white counts, no crystals, and elevated RBCs.  Patient was seen and in another clinic a few days after and was started on prednisone.  Despite this, swelling within the elbow has returned.  He has been consistently wearing a compression sleeve.  No fever or chills.   05/22/24: Jeremy Gonzales is a 58 y.o. male who presents today for evaluation of pain and swelling in his right elbow.  Patient reports that this began about 1 week ago with no known injury.  He did state that initially there was a small area over the elbow with a small drainage pus that he thought may have been an insect or spider bite.  Was seen in orthopedic clinic 5 days ago and had the olecranon bursa aspirated.  He was started on Bactrim DS as a precaution.  Today he states that fluid has returned and feels warm to touch.  He denies any drainage, fever, or chills.  No significant difficulty with elbow range of motion.   Surgical History:   None  PMH/PSH/Family History/Social History/Meds/Allergies:    Past Medical History:  Diagnosis Date   Anxiety    Arthritis    GERD (gastroesophageal reflux disease)    Hypertension    Pre-diabetes    Sleep apnea    inconsistent cpap use   Past Surgical History:  Procedure Laterality Date   ANAL FISSURECTOMY     COLONOSCOPY     CYSTOSCOPY N/A 03/31/2021   Procedure: CYSTOSCOPY;  Surgeon: Kassie Ozell SAUNDERS, MD;  Location: [facility];  Service: Urology;  Laterality: N/A;   ESOPHAGOGASTRODUODENOSCOPY     ESOPHAGOGASTRODUODENOSCOPY (EGD) WITH PROPOFOL  N/A 09/08/2022   Procedure: ESOPHAGOGASTRODUODENOSCOPY (EGD) WITH PROPOFOL ;  Surgeon: Jinny Carmine, MD;   Location: [facility];  Service: Endoscopy;  Laterality: N/A;  Sleep apnea   Social History   Socioeconomic History   Marital status: Married    Spouse name: Not on file   Number of children: Not on file   Years of education: Not on file   Highest education level: Not on file  Occupational History   Not on file  Tobacco Use   Smoking status: Never   Smokeless tobacco: Never  Vaping Use   Vaping status: Never Used  Substance and Sexual Activity   Alcohol use: Not Currently    Comment: social   Drug use: Never   Sexual activity: Yes  Other Topics Concern   Not on file  Social History Narrative   Not on file   Social Drivers of Health   Financial Resource Strain: Low Risk  (05/04/2024)   Received [facility]   Overall Financial Resource Strain (CARDIA)    Difficulty of Paying Living Expenses: Not hard at all  Food Insecurity: No Food Insecurity (05/04/2024)   Received from [facility]   Hunger Vital Sign    Within the past 12 months, you worried that your food would run out before you got the money to buy more.: Never true  Within the past 12 months, the food you bought just didn't last and you didn't have money to get more.: Never true  Transportation Needs: Unknown (05/04/2024)   Received from [facility] - Transportation    In the past 12 months, has lack of transportation kept you from medical appointments or from getting medications?: No    Lack of Transportation (Non-Medical): Not on file  Physical Activity: Insufficiently Active (02/02/2021)   Received from [facility]   Exercise Vital Sign    On average, how many days per week do you engage in moderate to strenuous exercise (like a brisk walk)?: 1 day    On average, how many minutes do you engage in exercise at this level?: 20 min  Stress: Stress Concern Present (02/02/2021)   Received from [facility] of  Occupational Health - Occupational Stress Questionnaire    Feeling of Stress : Very much  Social Connections: Unknown (11/02/2022)   Received from [facility]   Social Network    Social Network: Not on file   No family history on file. Allergies  Allergen Reactions   Metoclopramide     Other reaction(s): Hallucination   Promethazine     Other reaction(s): Makes patient feel as if he is crawling out of his skin.    Penicillins Rash    Childhood allergy   Current Outpatient Medications  Medication Sig Dispense Refill   alfuzosin  (UROXATRAL ) 10 MG 24 hr tablet Take 1 tablet (10 mg total) by mouth daily with breakfast. 90 tablet 3   amLODipine  (NORVASC ) 10 MG tablet Take 5 mg by mouth at bedtime.  0   LORazepam (ATIVAN) 0.5 MG tablet Take 0.5 mg by mouth daily as needed for anxiety.  0   Magnesium Hydroxide (DULCOLAX PO) Take by mouth daily.     ondansetron  (ZOFRAN -ODT) 4 MG disintegrating tablet Take 1 tablet (4 mg total) by mouth every 8 (eight) hours as needed. 20 tablet 1   Peppermint Oil (IBGARD PO) Take by mouth as needed.     RABEprazole  (ACIPHEX ) 20 MG tablet TAKE 1 TABLET BY MOUTH EVERY DAY IN THE MORNING 30 tablet 2   rosuvastatin (CRESTOR) 40 MG tablet Take 40 mg by mouth daily.     sucralfate (CARAFATE) 1 g tablet Take 1 g by mouth 2 (two) times daily.     tadalafil (CIALIS) 5 MG tablet Take 5 mg by mouth daily as needed for erectile dysfunction.     No current facility-administered medications for this visit.   No results found.  Review of Systems:   A ROS was performed including pertinent positives and negatives as documented in the HPI.  Physical Exam :   Constitutional: NAD and appears stated age Neurological: Alert and oriented Psych: Appropriate affect and cooperative There were no vitals taken for this visit.   Comprehensive Musculoskeletal Exam:    Moderate collection of fluctuant swelling noted over the right olecranon without significant tenderness,  erythema, or warmth.  Full painless active range of motion of the elbow.  Imaging:     Assessment:   58 y.o. male with swelling of the right elbow in the setting of inflammatory olecranon bursitis.  Synovial analysis was performed on last aspiration which showed no signs of infectious process.  Patient has been utilizing compression and was recently on prednisone without any improvement.  Given the persistence of his symptoms, I have recommended a repeat aspiration followed by cortisone  injection which patient is agreeable to.  25 cc of blood-tinged fluid was removed and successfully injected with 2 mL of both lidocaine  and Kenalog.  Recommend continued icing and compression.  Will have patient follow-up as needed, particularly if repeat aspiration may be necessary with potential conversation of bursectomy if swelling continues to return.  Plan :    - Successful aspiration and cortisone injection performed of the right olecranon bursa - Follow-up within 4 weeks if symptoms continue to persist      Procedure Note  Patient: Jeremy Gonzales             Date of Birth: 05/06/1966           MRN: [id number]             Visit Date: 06/02/2024  Procedures: Visit Diagnoses:  1. Olecranon bursitis, right elbow     Medium Joint Inj: R olecranon bursa on 06/02/2024 12:54 PM Indications: joint swelling Details: 18 G 1.5 in needle, posterior approach Medications: 2 mL lidocaine  1 %; 2 mL triamcinolone acetonide 40 MG/ML Aspirate: 25 mL cloudy and blood-tinged Outcome: tolerated well, no immediate complications Procedure, treatment alternatives, risks and benefits explained, specific risks discussed. Consent was given by the patient. Immediately prior to procedure a time out was called to verify the correct patient, procedure, equipment, support staff and site/side marked as required. Patient was prepped and draped in the usual sterile fashion.       I personally saw and evaluated the patient,  and participated in the management and treatment plan.  Leonce Reveal, PA-C Orthopedics
